# Patient Record
Sex: Female | Born: 1970 | Race: White | Hispanic: No | Marital: Married | Smoking: Never smoker
Health system: Southern US, Community
[De-identification: ages and names within clinical notes are randomized; demographics above are authoritative.]

## PROBLEM LIST (undated history)

## (undated) DIAGNOSIS — F32A Depression, unspecified: Secondary | ICD-10-CM

## (undated) DIAGNOSIS — I8290 Acute embolism and thrombosis of unspecified vein: Secondary | ICD-10-CM

## (undated) DIAGNOSIS — F329 Major depressive disorder, single episode, unspecified: Secondary | ICD-10-CM

## (undated) DIAGNOSIS — N800 Endometriosis of uterus: Secondary | ICD-10-CM

## (undated) DIAGNOSIS — D649 Anemia, unspecified: Secondary | ICD-10-CM

## (undated) DIAGNOSIS — Z86018 Personal history of other benign neoplasm: Secondary | ICD-10-CM

## (undated) DIAGNOSIS — G901 Familial dysautonomia [Riley-Day]: Secondary | ICD-10-CM

## (undated) DIAGNOSIS — R002 Palpitations: Secondary | ICD-10-CM

## (undated) DIAGNOSIS — Z8489 Family history of other specified conditions: Secondary | ICD-10-CM

## (undated) DIAGNOSIS — M722 Plantar fascial fibromatosis: Secondary | ICD-10-CM

## (undated) HISTORY — DX: Depression, unspecified: F32.A

## (undated) HISTORY — PX: TUBAL LIGATION: SHX77

## (undated) HISTORY — DX: Major depressive disorder, single episode, unspecified: F32.9

## (undated) HISTORY — DX: Plantar fascial fibromatosis: M72.2

## (undated) HISTORY — PX: LAPAROSCOPIC TUBAL LIGATION: SUR803

## (undated) HISTORY — DX: Personal history of other benign neoplasm: Z86.018

## (undated) HISTORY — DX: Palpitations: R00.2

## (undated) HISTORY — PX: OTHER SURGICAL HISTORY: SHX169

## (undated) HISTORY — DX: Anemia, unspecified: D64.9

## (undated) HISTORY — PX: ENDOMETRIAL ABLATION: SHX621

## (undated) HISTORY — DX: Endometriosis of uterus: N80.0

## (undated) HISTORY — DX: Acute embolism and thrombosis of unspecified vein: I82.90

---

## 2004-07-24 ENCOUNTER — Other Ambulatory Visit: Admission: RE | Admit: 2004-07-24 | Discharge: 2004-07-24 | Payer: Self-pay | Admitting: Obstetrics and Gynecology

## 2004-07-25 ENCOUNTER — Other Ambulatory Visit: Admission: RE | Admit: 2004-07-25 | Discharge: 2004-07-25 | Payer: Self-pay | Admitting: Obstetrics and Gynecology

## 2004-10-10 ENCOUNTER — Ambulatory Visit: Payer: Self-pay | Admitting: Hematology & Oncology

## 2004-12-05 ENCOUNTER — Ambulatory Visit (HOSPITAL_COMMUNITY): Admission: RE | Admit: 2004-12-05 | Discharge: 2004-12-05 | Payer: Self-pay | Admitting: Obstetrics and Gynecology

## 2005-02-15 ENCOUNTER — Inpatient Hospital Stay (HOSPITAL_COMMUNITY): Admission: AD | Admit: 2005-02-15 | Discharge: 2005-02-17 | Payer: Self-pay | Admitting: Obstetrics and Gynecology

## 2008-01-29 ENCOUNTER — Inpatient Hospital Stay (HOSPITAL_COMMUNITY): Admission: AD | Admit: 2008-01-29 | Discharge: 2008-01-29 | Payer: Self-pay | Admitting: Obstetrics and Gynecology

## 2008-03-23 ENCOUNTER — Ambulatory Visit (HOSPITAL_COMMUNITY): Admission: RE | Admit: 2008-03-23 | Discharge: 2008-03-23 | Payer: Self-pay | Admitting: Obstetrics & Gynecology

## 2008-04-20 ENCOUNTER — Ambulatory Visit (HOSPITAL_COMMUNITY): Admission: RE | Admit: 2008-04-20 | Discharge: 2008-04-20 | Payer: Self-pay | Admitting: Obstetrics & Gynecology

## 2008-05-16 ENCOUNTER — Inpatient Hospital Stay (HOSPITAL_COMMUNITY): Admission: AD | Admit: 2008-05-16 | Discharge: 2008-05-16 | Payer: Self-pay | Admitting: Obstetrics and Gynecology

## 2008-05-23 ENCOUNTER — Ambulatory Visit: Payer: Self-pay | Admitting: Cardiovascular Disease

## 2008-05-26 ENCOUNTER — Ambulatory Visit: Payer: Self-pay | Admitting: Cardiovascular Disease

## 2008-05-26 ENCOUNTER — Encounter: Payer: Self-pay | Admitting: Cardiovascular Disease

## 2008-05-26 ENCOUNTER — Ambulatory Visit: Payer: Self-pay

## 2008-07-04 ENCOUNTER — Encounter: Payer: Self-pay | Admitting: Cardiovascular Disease

## 2008-07-04 ENCOUNTER — Ambulatory Visit: Payer: Self-pay | Admitting: Cardiovascular Disease

## 2008-07-04 DIAGNOSIS — D689 Coagulation defect, unspecified: Secondary | ICD-10-CM | POA: Insufficient documentation

## 2008-07-04 DIAGNOSIS — R002 Palpitations: Secondary | ICD-10-CM | POA: Insufficient documentation

## 2008-07-26 ENCOUNTER — Inpatient Hospital Stay (HOSPITAL_COMMUNITY): Admission: AD | Admit: 2008-07-26 | Discharge: 2008-07-26 | Payer: Self-pay | Admitting: Obstetrics and Gynecology

## 2008-08-12 ENCOUNTER — Inpatient Hospital Stay (HOSPITAL_COMMUNITY): Admission: RE | Admit: 2008-08-12 | Discharge: 2008-08-14 | Payer: Self-pay | Admitting: Obstetrics and Gynecology

## 2008-08-18 ENCOUNTER — Inpatient Hospital Stay (HOSPITAL_COMMUNITY): Admission: AD | Admit: 2008-08-18 | Discharge: 2008-08-20 | Payer: Self-pay | Admitting: Obstetrics and Gynecology

## 2008-10-05 ENCOUNTER — Ambulatory Visit (HOSPITAL_COMMUNITY): Admission: RE | Admit: 2008-10-05 | Discharge: 2008-10-05 | Payer: Self-pay | Admitting: Obstetrics and Gynecology

## 2008-10-05 ENCOUNTER — Encounter (INDEPENDENT_AMBULATORY_CARE_PROVIDER_SITE_OTHER): Payer: Self-pay | Admitting: Obstetrics and Gynecology

## 2008-10-19 ENCOUNTER — Ambulatory Visit: Payer: Self-pay | Admitting: Oncology

## 2008-10-24 ENCOUNTER — Ambulatory Visit: Payer: Self-pay | Admitting: Cardiovascular Disease

## 2008-10-27 ENCOUNTER — Ambulatory Visit: Payer: Self-pay | Admitting: Cardiology

## 2008-10-31 ENCOUNTER — Ambulatory Visit: Payer: Self-pay | Admitting: Internal Medicine

## 2008-10-31 ENCOUNTER — Encounter: Payer: Self-pay | Admitting: Cardiovascular Disease

## 2008-10-31 LAB — CONVERTED CEMR LAB
INR: 2.1 — ABNORMAL HIGH (ref 0.0–1.5)
Prothrombin Time: 24.6 s — ABNORMAL HIGH (ref 11.6–15.2)

## 2008-11-01 LAB — CBC WITH DIFFERENTIAL/PLATELET
BASO%: 0.4 % (ref 0.0–2.0)
Basophils Absolute: 0 10*3/uL (ref 0.0–0.1)
EOS%: 2.3 % (ref 0.0–7.0)
Eosinophils Absolute: 0.1 10*3/uL (ref 0.0–0.5)
HCT: 31.9 % — ABNORMAL LOW (ref 34.8–46.6)
HGB: 10.6 g/dL — ABNORMAL LOW (ref 11.6–15.9)
LYMPH%: 25.8 % (ref 14.0–49.7)
MCH: 25.7 pg (ref 25.1–34.0)
MCHC: 33.2 g/dL (ref 31.5–36.0)
MCV: 77.3 fL — ABNORMAL LOW (ref 79.5–101.0)
MONO#: 0.2 10*3/uL (ref 0.1–0.9)
MONO%: 5.4 % (ref 0.0–14.0)
NEUT#: 3 10*3/uL (ref 1.5–6.5)
NEUT%: 66.1 % (ref 38.4–76.8)
Platelets: 256 10*3/uL (ref 145–400)
RBC: 4.13 10*6/uL (ref 3.70–5.45)
RDW: 16.5 % — ABNORMAL HIGH (ref 11.2–14.5)
WBC: 4.5 10*3/uL (ref 3.9–10.3)
lymph#: 1.2 10*3/uL (ref 0.9–3.3)

## 2008-11-04 LAB — HYPERCOAGULABLE PANEL, COMPREHENSIVE RET.
AntiThromb III Func: 130 % — ABNORMAL HIGH (ref 76–126)
Anticardiolipin IgA: <7 [APL'U] (ref <13)
Anticardiolipin IgG: <7 [GPL'U] (ref <11)
Anticardiolipin IgM: 12 [MPL'U] (ref <10)
Beta-2 Glyco I IgG: <4 U/mL (ref <20)
Beta-2-Glycoprotein I IgA: <4 U/mL (ref <10)
Beta-2-Glycoprotein I IgM: <4 U/mL (ref <10)
DRVVT 1:1 Mix: 43.3 secs (ref 36.1–47.0)
DRVVT: 75 secs — ABNORMAL HIGH (ref 36.1–47.0)
Homocysteine: 8.7 umol/L (ref 4.0–15.4)
PTT Lupus Anticoagulant: 60 secs — ABNORMAL HIGH (ref 36.3–48.8)
PTTLA 4:1 Mix: 47.4 secs (ref 36.3–48.8)
Protein C Activity: 21 % — ABNORMAL LOW (ref 75–133)
Protein C, Total: 62 % — ABNORMAL LOW (ref 70–140)
Protein S Activity: 34 % — ABNORMAL LOW (ref 69–129)
Protein S Ag, Total: 87 % (ref 70–140)

## 2008-11-04 LAB — COMPREHENSIVE METABOLIC PANEL
ALT: 15 U/L (ref 0–35)
AST: 14 U/L (ref 0–37)
Albumin: 4.2 g/dL (ref 3.5–5.2)
Alkaline Phosphatase: 83 U/L (ref 39–117)
BUN: 14 mg/dL (ref 6–23)
CO2: 23 mEq/L (ref 19–32)
Calcium: 9.2 mg/dL (ref 8.4–10.5)
Chloride: 107 mEq/L (ref 96–112)
Creatinine, Ser: 0.81 mg/dL (ref 0.40–1.20)
Glucose, Bld: 87 mg/dL (ref 70–99)
Potassium: 4.3 mEq/L (ref 3.5–5.3)
Sodium: 142 mEq/L (ref 135–145)
Total Bilirubin: 0.2 mg/dL — ABNORMAL LOW (ref 0.3–1.2)
Total Protein: 7.1 g/dL (ref 6.0–8.3)

## 2008-11-08 ENCOUNTER — Ambulatory Visit: Payer: Self-pay | Admitting: Cardiology

## 2008-11-22 ENCOUNTER — Ambulatory Visit: Payer: Self-pay | Admitting: Internal Medicine

## 2008-12-06 ENCOUNTER — Ambulatory Visit: Payer: Self-pay | Admitting: Cardiovascular Disease

## 2008-12-06 ENCOUNTER — Other Ambulatory Visit (HOSPITAL_COMMUNITY): Admission: RE | Admit: 2008-12-06 | Discharge: 2008-12-28 | Payer: Self-pay | Admitting: Psychiatry

## 2008-12-09 ENCOUNTER — Ambulatory Visit: Payer: Self-pay | Admitting: Psychiatry

## 2008-12-20 ENCOUNTER — Ambulatory Visit: Payer: Self-pay | Admitting: Cardiology

## 2008-12-29 ENCOUNTER — Encounter: Payer: Self-pay | Admitting: Cardiovascular Disease

## 2008-12-29 ENCOUNTER — Ambulatory Visit: Payer: Self-pay | Admitting: Cardiology

## 2009-01-05 ENCOUNTER — Ambulatory Visit: Payer: Self-pay | Admitting: Cardiology

## 2009-01-18 ENCOUNTER — Ambulatory Visit: Payer: Self-pay | Admitting: Cardiology

## 2009-03-06 ENCOUNTER — Ambulatory Visit: Payer: Self-pay | Admitting: Oncology

## 2009-03-13 ENCOUNTER — Encounter: Payer: Self-pay | Admitting: *Deleted

## 2009-03-13 LAB — PROTEIN S, TOTAL: Protein S Ag, Total: 122 % (ref 70–140)

## 2009-03-13 LAB — PROTEIN C, TOTAL: Protein C, Total: 113 % (ref 70–140)

## 2009-03-13 LAB — PROTEIN S ACTIVITY: Protein S Activity: 131 % — ABNORMAL HIGH (ref 69–129)

## 2009-03-13 LAB — PROTEIN C ACTIVITY: Protein C Activity: 65 % — ABNORMAL LOW (ref 75–133)

## 2010-08-28 NOTE — Assessment & Plan Note (Signed)
Summary: Cardiology Office Visit   Chief Complaint:  Palpitatoins.  History of Present Illness: Christer returns today for followup.she was having palpitations. The the patien's due date is in January.  I reviewed her event monitor which was worn between October 29 of November 18. There were no significant arrhythmias and only an occasional PVC. The patient's echocardiogram was also normal.since I last saw her she has not had any significant palpitations. She's had a bit of lower extremity edema. Overall her pregnancy is progressing well. She continues to take Lovenox shots.  She understands the importance of getting retested for protein C deficiency after her pregnancy is over. From a cardiac perspective she did not need any further testing.    Prior Medication List:  No prior medications documented  Current Allergies: No known allergies       Review of Systems       review of system is negative in regards to syncope chest pain. She does have exertional dyspnea consistent with gestational age.   Vital Signs:  Patient Profile:   40 Years Old Female Weight:      259 pounds Pulse rate:   98 / minute BP supine:   115 / 75                  Physical Exam  General:     well developed, well nourished, in no acute distress.   Head:     normocephalic and atraumatic.   Eyes:     PERRLA/EOM intact; fundi benign, conjunctiva and sclera clear.   Ears:     TMs intact and clear with normal canals and hearing.   Nose:     no deformity, discharge, inflammation, or lesions.   Mouth:     no deformity or lesions with good dentition.   Neck:     no masses, thyromegaly, or abnormal cervical nodes.   Chest Wall:     no deformities or breast masses noted.   Lungs:     clear bilaterally to A & P.   Heart:     regular rate and rhythm, S1, S2 soft systolic murmurs, rubs, gallops, or clicks.   Abdomen:     bowel sounds positive; abdomen soft non-tender without masses, organomegaly,  or hernias noted.    Protuberant consistant with gestational age MSK:     no deformity or scoliosis noted with normal posture and gait.   Pulses:     pulses normal in all 4 extremities.   Extremities:     mild edema Neurologic:     no focal deficits, CN II-XII grossly intact with normal reflexes, coordination, muscle strength and tone.   Skin:     intact without lesions or rashes.   Cervical Nodes:     no significant adenopathy.   Axillary Nodes:     no significant adenopathy.   Inguinal Nodes:     no significant adenopathy.   Psych:     alert and cooperative; normal mood and affect; normal attention span and concentration.     Impression & Recommendations:  Problem # 1:  PALPITATIONS (ICD-785.1) Benign palpitations with no significant arrythmias on event monitor. No need for further testing or imaging.  No need for beta blockers Her updated medication list for this problem includes:    Lovenox 30 Mg/0.14ml Soln (Enoxaparin sodium) ..... Sub cutaneous 30mg  two times a day   Problem # 2:  PROTEIN C DEFICIENCY (ICD-286.9) Continue Lovenox during pregnancy.  Retest for Protein  C  deficiency after pregnancy before commiting to coumadin  Medications Added to Medication List This Visit: 1)  Lovenox 30 Mg/0.52ml Soln (Enoxaparin sodium) .... Sub cutaneous 30mg  two times a day   Patient Instructions: 1)  F/U with me in March 2)  Call if palpitations worse 3)  Get hematology labs done about 4 weeks post delivery    ]

## 2010-08-28 NOTE — Letter (Signed)
Summary: [facility]   [facility]   Imported By: [name] 01/02/2010 12:13:18  _____________________________________________________________________  External Attachment:    Type:   Image     Comment:   External Document

## 2010-11-08 LAB — CBC
HCT: 34.2 % — ABNORMAL LOW (ref 36.0–46.0)
Hemoglobin: 11.1 g/dL — ABNORMAL LOW (ref 12.0–15.0)
MCHC: 32.5 g/dL (ref 30.0–36.0)
MCV: 80.3 fL (ref 78.0–100.0)
Platelets: 219 10*3/uL (ref 150–400)
RBC: 4.26 MIL/uL (ref 3.87–5.11)
RDW: 18.2 % — ABNORMAL HIGH (ref 11.5–15.5)
WBC: 5.9 10*3/uL (ref 4.0–10.5)

## 2010-11-08 LAB — PROTEIN C, TOTAL: Protein C, Total: 104 % (ref 70–140)

## 2010-11-08 LAB — URINALYSIS, ROUTINE W REFLEX MICROSCOPIC
Bilirubin Urine: NEGATIVE
Glucose, UA: NEGATIVE mg/dL
Hgb urine dipstick: NEGATIVE
Ketones, ur: NEGATIVE mg/dL
Nitrite: NEGATIVE
Protein, ur: NEGATIVE mg/dL
Specific Gravity, Urine: 1.01 (ref 1.005–1.030)
Urobilinogen, UA: 0.2 mg/dL (ref 0.0–1.0)
pH: 6 (ref 5.0–8.0)

## 2010-11-08 LAB — PROTEIN C ACTIVITY: Protein C Activity: 193 % — ABNORMAL HIGH (ref 75–133)

## 2010-11-08 LAB — PREGNANCY, URINE: Preg Test, Ur: NEGATIVE

## 2010-11-12 LAB — RH IMMUNE GLOB WKUP(>/=20WKS)(NOT WOMEN'S HOSP): Fetal Screen: NEGATIVE

## 2010-11-12 LAB — CREATININE, SERUM
Creatinine, Ser: 0.45 mg/dL (ref 0.4–1.2)
GFR calc Af Amer: >60 mL/min (ref >60)
GFR calc non Af Amer: >60 mL/min (ref >60)

## 2010-11-12 LAB — CBC
HCT: 29.1 % — ABNORMAL LOW (ref 36.0–46.0)
HCT: 23.2 % — ABNORMAL LOW (ref 36.0–46.0)
HCT: 27.4 % — ABNORMAL LOW (ref 36.0–46.0)
Hemoglobin: 9.5 g/dL — ABNORMAL LOW (ref 12.0–15.0)
Hemoglobin: 7.8 g/dL — CL (ref 12.0–15.0)
Hemoglobin: 9.1 g/dL — ABNORMAL LOW (ref 12.0–15.0)
MCHC: 32.7 g/dL (ref 30.0–36.0)
MCHC: 33.2 g/dL (ref 30.0–36.0)
MCHC: 33.6 g/dL (ref 30.0–36.0)
MCV: 80.8 fL (ref 78.0–100.0)
MCV: 80.8 fL (ref 78.0–100.0)
MCV: 81.1 fL (ref 78.0–100.0)
Platelets: 183 10*3/uL (ref 150–400)
Platelets: 155 10*3/uL (ref 150–400)
Platelets: 196 10*3/uL (ref 150–400)
RBC: 3.6 MIL/uL — ABNORMAL LOW (ref 3.87–5.11)
RBC: 2.86 MIL/uL — ABNORMAL LOW (ref 3.87–5.11)
RBC: 3.39 MIL/uL — ABNORMAL LOW (ref 3.87–5.11)
RDW: 19.1 % — ABNORMAL HIGH (ref 11.5–15.5)
RDW: 19 % — ABNORMAL HIGH (ref 11.5–15.5)
RDW: 20 % — ABNORMAL HIGH (ref 11.5–15.5)
WBC: 8.8 10*3/uL (ref 4.0–10.5)
WBC: 13.9 10*3/uL — ABNORMAL HIGH (ref 4.0–10.5)
WBC: 6.2 10*3/uL (ref 4.0–10.5)

## 2010-11-12 LAB — DIFFERENTIAL
Basophils Absolute: 0 10*3/uL (ref 0.0–0.1)
Basophils Relative: 0 % (ref 0–1)
Eosinophils Absolute: 0.1 10*3/uL (ref 0.0–0.7)
Eosinophils Relative: 2 % (ref 0–5)
Lymphocytes Relative: 15 % (ref 12–46)
Lymphs Abs: 0.9 10*3/uL (ref 0.7–4.0)
Monocytes Absolute: 0.6 10*3/uL (ref 0.1–1.0)
Monocytes Relative: 10 % (ref 3–12)
Neutro Abs: 4.6 10*3/uL (ref 1.7–7.7)
Neutrophils Relative %: 74 % (ref 43–77)

## 2010-11-12 LAB — RPR: RPR Ser Ql: NONREACTIVE

## 2010-12-11 NOTE — Assessment & Plan Note (Signed)
[facility]                            CARDIOLOGY OFFICE NOTE   Maria, Moore                      MRN:          [id number]  DATE:05/23/2008                            DOB:          30-Dec-1970    REFERRING PHYSICIAN:  Carrington Clamp, MD   Maria Moore is a delightful 37-year patient referred by Dr. Henderson Cloud.  She is gravida 5, para 4.  She has been having palpitations and  dizziness.   The patient carries a diagnosis of protein C deficiency and is on  Lovenox.  In talking to her, she had day a blood clot in her legs  diagnosed after the birth of her first child.  Apparently, this was in  [place].  She has not had any recurrences since that time.  However,  since that time, she has not been on Coumadin or anticoagulants.  Apparently, she was told that if she did not have a second blood clot  not to worry about it.  The patient's palpitations appear benign.  She  tends to be tachycardic with her pregnancy.  She has occasional flip-  flops and feels 3 or 4 heart beats that feel strong.  Sometimes she can  be dizzy without the palpitations.  Her dizziness is actually  independent of position and not related to her palpitations.  It sounds  more vertiginous.   She has not had a significant headache.  She has actually not passed  out.  Her pregnancy is otherwise uncomplicated.  All of her previous  births have been vaginal.   She is currently 24 leaks along and both her and the baby seemed to be  doing well.  She is getting Lovenox shots twice a day.   PAST MEDICAL HISTORY:  Otherwise remarkable for a history of protein C  deficiency, history of tubes, inner ear, and a D&C.   She has otherwise been fairly healthy.   FAMILY HISTORY:  Remarkable for her mother having some kind of  congestive or congenital heart problem at the age of 30.  She is still  alive.  Father still alive.  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  She has  4 healthy children.  She is happily married.  She does not drink or  smoke.   She has 1 cup of coffee a day.   REVIEW OF SYSTEMS:  Otherwise remarkable for typical swelling in the  ankles from her pregnancy.   Her current medications include Lovenox 30 mg subcu b.i.d. and prenatal  vitamins.   ALLERGIES:  She has no known allergies.   PHYSICAL EXAMINATION:  GENERAL:  Remarkable for a middle-aged white  female in no distress.  VITAL SIGNS:  Her weight is 254, blood pressure is 117/77, pulse is 95  and regular, respiratory 14, afebrile.  HEENT:  Unremarkable.  NECK:  Carotids normal without bruit. No lymphadenopathy, no  thyromegaly, no JVP elevation.  LUNGS:  Clear, good diaphragmatic motion. No wheezing.  CARDIAC:  S1, S2 with normal heart sounds. PMI not  palpable.  ABDOMEN:  Protuberant consistent with gestational age.  No AAA, no  tenderness, no hepatosplenomegaly, no hepatojugular reflux, or  tenderness.  EXTREMITIES:  Distal pulses were intact, trace edema.  NEUROLOGIC:  Nonfocal.  SKIN:  Warm and dry.  MUSCULOSKELETAL:  No muscular weakness.   Her electrocardiogram shows normal sinus rhythm, poor R-wave progression  lightly to lead placement, normal P-waves upright in II, III, and F.  QT  interval 344.   Routine lab work done May 16, 2008, showed a creatinine of 0.5 and  negative cardiac markers   BNP was less than 30.   IMPRESSION:  1. Palpitations, they sound benign.  We have given her event monitor      for 4-6 weeks.  No beta-blocker therapy or other indicated at this      time.  2. Dizziness with dyspnea likely again related to her pregnancy.  No      evidence of CNS pathology.  Check 2-D echocardiogram and assess      right ventricular and left ventricular function.  3. Question protein C deficiency.  I find it a little strange that the      patient has been placed on Lovenox during this pregnancy,  but does      not take Coumadin on a regular basis.  I strongly encouraged her to      see a hematologist and get protein C levels drawn again after her      pregnancy to see if her levels are truly suppressed and if she is      at risk for clotting events even when she is not pregnant.  She      would concur with this.   I will see her back after her echo on event monitor.     Noralyn Pick. Eden Emms, MD, [facility]  Electronically Signed    PCN/MedQ  DD: 05/23/2008  DT: 05/24/2008  Job #: [id number]

## 2010-12-11 NOTE — Assessment & Plan Note (Signed)
[facility]                                 ON-CALL NOTE   Maria Moore, Maria Moore                      MRN:          [id number]  DATE:10/28/2008                            DOB:          [birth year]    PRIMARY CARDIOLOGIST:  Noralyn Pick. Eden Emms, MD, [facility]   [name] called through the answering service today requesting a  refill on medication.  She states she was seen in the [place] office  on Thursday, October 27, 2008 with a subtherapeutic INR and was told that a  prescription for Lovenox would be send in to the [facility], on [address] in [place].  She states she went to pick up the refill on her  Lovenox and there was not a prescription there.  In talking with [name], she reports a history of blood clots and has been on Lovenox  since October 17, 2008 and was started on Coumadin on October 20, 2008, and  is still subtherapeutic.  She states she has an appointment to have her  INR repeated on Monday, but is requesting refill on the Lovenox.  I did  speak with the pharmacy at [facility] and confirmed the patient is on  Lovenox 100 mg b.i.d. and Coumadin 5 mg daily or as directed.  I also  raised 20 doses of the Lovenox without refills and the patient is to  follow up with a repeat PT/INR on Monday.  The patient is aware of  instructions.      Dorian Pod, ACNP  Electronically Signed      Luis Abed, MD, [facility]  Electronically Signed   MB/MedQ  DD: 10/28/2008  DT: 10/28/2008  Job #: [id number]

## 2010-12-11 NOTE — Discharge Summary (Signed)
NAME:  Maria Moore, Maria Moore               ACCOUNT NO.:  [id number]   MEDICAL RECORD NO.:  [id number]          PATIENT TYPE:  INP   LOCATION:  9320                          FACILITY:  [facility]   PHYSICIAN:  Carrington Clamp, M.D. DATE OF BIRTH:  10/09/1970   DATE OF ADMISSION:  08/18/2008  DATE OF DISCHARGE:  08/20/2008                               DISCHARGE SUMMARY   ADMITTING DIAGNOSIS:  Postpartum endometritis.   DISCHARGE DIAGNOSIS:  Postpartum endometritis.   PERTINENT TESTS PERFORMED:  White blood cell count of 6.2 and H&H of 7.8  and 23.2.   PERTINENT PROCEDURES PERFORMED:  IV antibiotics.   HISTORY AND PHYSICAL:  Please refer to history and physical on chart.  Briefly, A 40 year old G7, P5, postoperative day #6 with increased pain  and chills previous night and difficulty walking.  The patient did not  complain of nausea or vomiting, but was having significant abdominal  pain.  The patient states her temperature last night had been elevated  and had gone to 99.3 on Motrin.  The patient had been treated for  postpartum endometritis at the hospital after the delivery.  See the  chart for the rest of the history.  Significantly, the patient was on  Lovenox 30 mg subcu b.i.d. secondary to history of protein C deficiency  (protein C deficiency is questionable at this point).  The patient was  admitted for amp, gent, and clindamycin IV antibiotics, and by  postoperative day #3, had been afebrile with significant decrease in  pain.  The patient was feeling better and was discharged with the  following, Augmentin 875 mg 1 p.o. b.i.d., continue the Lovenox, iron  over-the-counter as tolerated, and the patient was to follow up in 1  week.      Carrington Clamp, M.D.  Electronically Signed     MH/MEDQ  D:  11/03/2008  T:  11/04/2008  Job:  [id number]

## 2010-12-11 NOTE — Op Note (Signed)
NAME:  Maria Moore, Maria Moore NO.:  [id number]   MEDICAL RECORD NO.:  [id number]          PATIENT TYPE:  AMB   LOCATION:  SDC                           [facility]ACILITY:  [facility]   PHYSICIAN:  Carrington Clamp, M.D. DATE OF BIRTH:  06-Sep-1970   DATE OF PROCEDURE:  DATE OF DISCHARGE:                               OPERATIVE REPORT   PREOPERATIVE DIAGNOSIS:  Multiparous, desires permit sterility and  menorrhagia.   POSTOPERATIVE DIAGNOSES:  Multiparous, desires permit sterility and  menorrhagia, plus endometrial polyps.   PROCEDURES:  Filshie clip, bilateral tubal ligation, dilation and  curettage, hysteroscopy, and NovaSure ablation.   SURGEON:  Carrington Clamp, MD   ASSISTANTS:  None.   ANESTHESIA:  General.   FINDINGS:  A 10- to 11-week size boggy uterus.  The right ovary appeared  normal.  The left ovary was about 4 cm with multiple functional cyst,  otherwise appearing normal.  Tubes were normal bilaterally.  There were  polyps in the endometrial cavity.   SPECIMENS:  Uterine curettings sent to Pathology.   ESTIMATED BLOOD LOSS:  Minimal.   IV FLUIDS:   URINE OUTPUT:  Not measured.   COMPLICATIONS:  The uterine cavity sounded to 10 cm, but may have been  bigger.  Post NovaSure ablation, it appeared that the upper portion of  the uterine cavity had not been contacted with the NovaSure instrument.  The patient was expected to have some relief, but may not be amenorrheic  postoperatively.  The hysteroscopic deficit was 70 mL.  Cavity length  was measured at 7, but we were only able to extend the instrument to  6.5.  Cavity width was 4.2, power was 150, and time was 50 seconds.  Small laceration to cervix with a single-tooth tenaculum.   TECHNIQUE:  After adequate general anesthesia was achieved, the patient  was prepped and draped in usual sterile fashion with SCDs on secondary  to history of protein S deficiency.  A 1 cm incision was made below the  umbilicus with  the scalpel, and the Veress needle was passed into the  abdomen without aspiration of bowel contents or blood.  The abdomen was  insufflated, and the 10-mm trocar was placed without complication.  The  scope was passed into the abdomen and the above findings noted.  A  Filshie clip was placed on the isthmic portion of each tube and was  confirmed to be around the entirety of the tube.  The scope was then  removed from the abdomen.  Abdomen was desufflated, but the trocar left  in place.  Attention was then turned to the vagina where the uterine  manipulator that had been placed before the abdominal incision was  removed and the cervix was grasped with a single-tooth tenaculum.  The  cervix was dilated up, and using multiple modalities the estimated  cavity length was seen at 10 cm.  The cervix was measured at 3, and the  NovaSure instrument was set at 6.5.  The hysteroscope was then passed  into the cavity, and the cervical length was confirmed again.  The above  findings were noted.  It was difficult to see the tube orifices as there  was a lot of tissue.  D and C was then performed where a large amount of  tissue was obtained and sent to Pathology.  The NovaSure instrument was  then set with the above settings and proceeded for 50 seconds after the  CO2 cavity assessment test was passed.  The hysteroscope was passed into  the uterine cavity afterwards, and the top of the uterus was noticed to  have not enough contact.  It is likely that despite the efforts to  sounds correctly that the cavity length was actually longer then had  been expected and that this was the optimal results we could achieve  with the NovaSure.   All instruments were withdrawn from the uterus, and a small tear on the  cervix was repaired with 2 stitches of 2-0 Vicryl.  Once hemostasis was  achieved, the instruments were withdrawn from the vagina and after the  uterine manipulator had been replaced and the bladder  emptied again of  clear urine.  The scope was passed into the abdominal cavity to indicate  intact uterus without any abnormalities and everything was removed from  the abdomen.  Abdomen was desufflated.  The 1-cm fascial incision was  closed with a figure-of-eight stitch of 2-0 Vicryl.  The skin was closed  with Dermabond.  All instruments were withdrawn from the vagina once the  cervix was again seen as being hemostatic.  The patient tolerated the  procedure well, was returned to recovery room in stable condition.      Carrington Clamp, M.D.  Electronically Signed     MH/MEDQ  D:  10/05/2008  T:  10/05/2008  Job:  [id number]

## 2011-01-17 ENCOUNTER — Encounter: Payer: Self-pay | Admitting: Cardiovascular Disease

## 2011-04-29 LAB — CK TOTAL AND CKMB (NOT AT ARMC)
CK, MB: 1
Relative Index: INVALID
Total CK: 45

## 2011-04-29 LAB — B-NATRIURETIC PEPTIDE (CONVERTED LAB): Pro B Natriuretic peptide (BNP): <30

## 2011-04-29 LAB — TROPONIN I: Troponin I: 0.01

## 2011-04-29 LAB — COMPREHENSIVE METABOLIC PANEL
ALT: 19
AST: 15
Albumin: 2.6 — ABNORMAL LOW
Alkaline Phosphatase: 60
BUN: 5 — ABNORMAL LOW
CO2: 24
Calcium: 8.5
Chloride: 108
Creatinine, Ser: 0.53
GFR calc Af Amer: >60
GFR calc non Af Amer: >60
Glucose, Bld: 86
Potassium: 3.9
Sodium: 136
Total Bilirubin: 0.3
Total Protein: 5.5 — ABNORMAL LOW

## 2011-06-11 ENCOUNTER — Ambulatory Visit: Payer: Self-pay | Admitting: Internal Medicine

## 2011-06-11 ENCOUNTER — Encounter: Payer: Self-pay | Admitting: Internal Medicine

## 2011-06-11 ENCOUNTER — Ambulatory Visit (INDEPENDENT_AMBULATORY_CARE_PROVIDER_SITE_OTHER): Payer: 59 | Admitting: Internal Medicine

## 2011-06-11 DIAGNOSIS — E669 Obesity, unspecified: Secondary | ICD-10-CM

## 2011-06-11 DIAGNOSIS — D689 Coagulation defect, unspecified: Secondary | ICD-10-CM

## 2011-06-11 DIAGNOSIS — Z8672 Personal history of thrombophlebitis: Secondary | ICD-10-CM

## 2011-06-11 DIAGNOSIS — J40 Bronchitis, not specified as acute or chronic: Secondary | ICD-10-CM

## 2011-06-11 DIAGNOSIS — H6690 Otitis media, unspecified, unspecified ear: Secondary | ICD-10-CM

## 2011-06-11 DIAGNOSIS — Z86718 Personal history of other venous thrombosis and embolism: Secondary | ICD-10-CM | POA: Insufficient documentation

## 2011-06-11 DIAGNOSIS — Z9851 Tubal ligation status: Secondary | ICD-10-CM

## 2011-06-11 LAB — CBC WITH DIFFERENTIAL/PLATELET
Basophils Absolute: 0 10*3/uL (ref 0.0–0.1)
Basophils Relative: 0.5 % (ref 0.0–3.0)
Eosinophils Absolute: 0.2 10*3/uL (ref 0.0–0.7)
Eosinophils Relative: 3.1 % (ref 0.0–5.0)
HCT: 36.7 % (ref 36.0–46.0)
Hemoglobin: 12.4 g/dL (ref 12.0–15.0)
Lymphocytes Relative: 31.8 % (ref 12.0–46.0)
Lymphs Abs: 1.9 10*3/uL (ref 0.7–4.0)
MCHC: 33.8 g/dL (ref 30.0–36.0)
MCV: 84.3 fl (ref 78.0–100.0)
Monocytes Absolute: 0.3 10*3/uL (ref 0.1–1.0)
Monocytes Relative: 5.6 % (ref 3.0–12.0)
Neutro Abs: 3.5 10*3/uL (ref 1.4–7.7)
Neutrophils Relative %: 59 % (ref 43.0–77.0)
Platelets: 191 10*3/uL (ref 150.0–400.0)
RBC: 4.36 Mil/uL (ref 3.87–5.11)
RDW: 16.6 % — ABNORMAL HIGH (ref 11.5–14.6)
WBC: 6 10*3/uL (ref 4.5–10.5)

## 2011-06-11 MED ORDER — BENZONATATE 200 MG PO CAPS: 200.0000 mg | ORAL_CAPSULE | Freq: Three times a day (TID) | ORAL | Status: AC | PRN

## 2011-06-11 MED ORDER — GUAIFENESIN-CODEINE 100-10 MG/5ML PO SYRP: 5.0000 mL | ORAL_SOLUTION | Freq: Two times a day (BID) | ORAL | Status: DC | PRN

## 2011-06-11 MED ORDER — AMOXICILLIN-POT CLAVULANATE 875-125 MG PO TABS: 1.0000 | ORAL_TABLET | Freq: Two times a day (BID) | ORAL | Status: DC
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
## 2011-06-11 NOTE — Assessment & Plan Note (Signed)
Diagnosis was ruled out  By Hematology evaluation in 2010 .

## 2011-06-12 ENCOUNTER — Encounter: Payer: Self-pay | Admitting: Internal Medicine

## 2011-06-12 DIAGNOSIS — I8290 Acute embolism and thrombosis of unspecified vein: Secondary | ICD-10-CM | POA: Insufficient documentation

## 2011-06-12 LAB — COMPREHENSIVE METABOLIC PANEL
ALT: 24 U/L (ref 0–35)
AST: 26 U/L (ref 0–37)
Albumin: 3.8 g/dL (ref 3.5–5.2)
Alkaline Phosphatase: 68 U/L (ref 39–117)
BUN: 12 mg/dL (ref 6–23)
CO2: 25 mEq/L (ref 19–32)
Calcium: 9 mg/dL (ref 8.4–10.5)
Chloride: 107 mEq/L (ref 96–112)
Creatinine, Ser: 0.7 mg/dL (ref 0.4–1.2)
GFR: 92.77 mL/min (ref >60.00)
Glucose, Bld: 71 mg/dL (ref 70–99)
Potassium: 3.9 mEq/L (ref 3.5–5.1)
Sodium: 140 mEq/L (ref 135–145)
Total Bilirubin: 0.5 mg/dL (ref 0.3–1.2)
Total Protein: 7.1 g/dL (ref 6.0–8.3)

## 2011-06-12 LAB — LIPID PANEL
Cholesterol: 219 mg/dL — ABNORMAL HIGH (ref 0–200)
HDL: 51.4 mg/dL (ref >39.00)
Total CHOL/HDL Ratio: 4
Triglycerides: 91 mg/dL (ref 0.0–149.0)
VLDL: 18.2 mg/dL (ref 0.0–40.0)

## 2011-06-12 LAB — TSH: TSH: 1.39 u[IU]/mL (ref 0.35–5.50)

## 2011-06-12 LAB — LDL CHOLESTEROL, DIRECT: Direct LDL: 164.4 mg/dL

## 2011-06-17 ENCOUNTER — Encounter: Payer: Self-pay | Admitting: Internal Medicine

## 2011-06-17 ENCOUNTER — Other Ambulatory Visit: Payer: 59

## 2011-06-17 ENCOUNTER — Ambulatory Visit (INDEPENDENT_AMBULATORY_CARE_PROVIDER_SITE_OTHER): Payer: 59 | Admitting: Internal Medicine

## 2011-06-17 VITALS — BP 116/76 | HR 97 | Temp 98.5°F | Resp 14 | Ht 64.5 in | Wt 251.0 lb

## 2011-06-17 DIAGNOSIS — H6690 Otitis media, unspecified, unspecified ear: Secondary | ICD-10-CM

## 2011-06-17 DIAGNOSIS — D649 Anemia, unspecified: Secondary | ICD-10-CM

## 2011-06-17 DIAGNOSIS — E611 Iron deficiency: Secondary | ICD-10-CM

## 2011-06-17 MED ORDER — AMOXICILLIN-POT CLAVULANATE 875-125 MG PO TABS: 1.0000 | ORAL_TABLET | Freq: Two times a day (BID) | ORAL | Status: AC

## 2011-06-17 NOTE — Progress Notes (Signed)
  Subjective:    Patient ID: Maria Moore, female    DOB: 09/27/1970, 40 y.o.   MRN: [id number]  HPI  40 yo white female treated last week at first visit for bronchitis, returns today with improved cough but worsening facial pain and left ear pain.  Entire household is also sick, coughing.  No dizziness, wheezing, severe headaches or neck pain,.  No GI symptoms or GYN symptoms.   Past Medical History  Diagnosis Date  . Palpitations   . VTE (venous thromboembolism)     post partum, post procedure, Protein C deficiency ruled out    Current Outpatient Prescriptions on File Prior to Visit  Medication Sig Dispense Refill  . benzonatate (TESSALON) 200 MG capsule Take 1 capsule (200 mg total) by mouth 3 (three) times daily as needed for cough.  60 capsule  3  . guaiFENesin-codeine (ROBITUSSIN AC) 100-10 MG/5ML syrup Take 5 mLs by mouth 2 (two) times daily as needed for cough.  240 mL  0  . sertraline (ZOLOFT) 100 MG tablet Take 100 mg by mouth daily.          Review of Systems  Constitutional: Negative for fever, chills and unexpected weight change.  HENT: Negative for hearing loss, ear pain, nosebleeds, congestion, sore throat, facial swelling, rhinorrhea, sneezing, mouth sores, trouble swallowing, neck pain, neck stiffness, voice change, postnasal drip, sinus pressure, tinnitus and ear discharge.   Eyes: Negative for pain, discharge, redness and visual disturbance.  Respiratory: Negative for cough, chest tightness, shortness of breath, wheezing and stridor.   Cardiovascular: Negative for chest pain, palpitations and leg swelling.  Musculoskeletal: Negative for myalgias and arthralgias.  Skin: Negative for color change and rash.  Neurological: Negative for dizziness, weakness, light-headedness and headaches.  Hematological: Negative for adenopathy.       Objective:   Physical Exam  Constitutional: She is oriented to person, place, and time. She appears well-developed and well-nourished.   HENT:  Right Ear: Tympanic membrane is injected and bulging.  Left Ear: Tympanic membrane is injected. Tympanic membrane mobility is abnormal.  Mouth/Throat: Mucous membranes are normal. Posterior oropharyngeal edema and posterior oropharyngeal erythema present. No oropharyngeal exudate or tonsillar abscesses.  Eyes: EOM are normal. Pupils are equal, round, and reactive to light. No scleral icterus.  Neck: Normal range of motion. Neck supple. No JVD present. No thyromegaly present.  Cardiovascular: Normal rate, regular rhythm, normal heart sounds and intact distal pulses.   Pulmonary/Chest: Effort normal and breath sounds normal.  Abdominal: Soft. Bowel sounds are normal. She exhibits no mass. There is no tenderness.  Musculoskeletal: Normal range of motion. She exhibits no edema.  Lymphadenopathy:    She has no cervical adenopathy.  Neurological: She is alert and oriented to person, place, and time.  Skin: Skin is warm and dry.  Psychiatric: She has a normal mood and affect.          Assessment & Plan:  Otitis media: resutling from sinus congestion.  Change abx to augmentin.  Add sudafed PE 10 mg every 6 hours.   Microcytosis: with history of anemia.  Will check iron stores, B12 and folate.

## 2011-06-17 NOTE — Patient Instructions (Addendum)
Your exam is now consistent with otitis media,  Which probably resulted from the congestion you have been having,  Please take Sudafed PE 10 mg every 6 hours for ear pain and sinus congestion  We are changing antibiotics to Augmentin one tablet twice daily for 7 more days

## 2011-06-18 LAB — IRON AND TIBC
%SAT: 8 % — ABNORMAL LOW (ref 20–55)
Iron: 30 ug/dL — ABNORMAL LOW (ref 42–145)
TIBC: 353 ug/dL (ref 250–470)
UIBC: 323 ug/dL (ref 125–400)

## 2011-06-18 LAB — FOLATE RBC: RBC Folate: 681 ng/mL (ref >=366)

## 2011-06-18 LAB — FERRITIN: Ferritin: 13.2 ng/mL (ref 10.0–291.0)

## 2011-06-18 LAB — VITAMIN B12: Vitamin B-12: 494 pg/mL (ref 211–911)

## 2011-06-18 MED ORDER — FERROUS SULFATE 325 (65 FE) MG PO TABS: 325.0000 mg | ORAL_TABLET | Freq: Every day | ORAL | Status: DC

## 2011-06-18 NOTE — Progress Notes (Signed)
Addended by: Duncan Dull on: 06/18/2011 05:24 PM   Modules accepted: Orders

## 2011-07-04 ENCOUNTER — Encounter: Payer: Self-pay | Admitting: Internal Medicine

## 2011-10-04 ENCOUNTER — Encounter: Payer: Self-pay | Admitting: Internal Medicine

## 2011-10-04 ENCOUNTER — Ambulatory Visit (INDEPENDENT_AMBULATORY_CARE_PROVIDER_SITE_OTHER): Payer: 59 | Admitting: Internal Medicine

## 2011-10-04 VITALS — BP 134/90 | HR 120 | Temp 98.6°F | Resp 18 | Ht 64.0 in | Wt 254.2 lb

## 2011-10-04 DIAGNOSIS — J45901 Unspecified asthma with (acute) exacerbation: Secondary | ICD-10-CM

## 2011-10-04 DIAGNOSIS — J209 Acute bronchitis, unspecified: Secondary | ICD-10-CM | POA: Insufficient documentation

## 2011-10-04 DIAGNOSIS — J45909 Unspecified asthma, uncomplicated: Secondary | ICD-10-CM

## 2011-10-04 DIAGNOSIS — J40 Bronchitis, not specified as acute or chronic: Secondary | ICD-10-CM

## 2011-10-04 MED ORDER — PREDNISONE (PAK) 10 MG PO TABS: ORAL_TABLET | ORAL | Status: AC

## 2011-10-04 MED ORDER — ALBUTEROL SULFATE HFA 108 (90 BASE) MCG/ACT IN AERS: 2.0000 | INHALATION_SPRAY | Freq: Four times a day (QID) | RESPIRATORY_TRACT | Status: DC | PRN

## 2011-10-04 MED ORDER — AZITHROMYCIN 500 MG PO TABS: 500.0000 mg | ORAL_TABLET | Freq: Every day | ORAL | Status: AC

## 2011-10-04 MED ORDER — METHYLPREDNISOLONE ACETATE PF 40 MG/ML IJ SUSP
40.0000 mg | Freq: Once | INTRAMUSCULAR | Status: AC
  Administered 2011-10-04: 40 mg via INTRAMUSCULAR

## 2011-10-04 MED ORDER — HYDROCOD POLST-CHLORPHEN POLST 10-8 MG/5ML PO LQCR: 5.0000 mL | Freq: Two times a day (BID) | ORAL | Status: DC | PRN

## 2011-10-04 NOTE — Progress Notes (Signed)
  Subjective:    Patient ID: Maria Moore, female    DOB: July 09, 1971, 41 y.o.   MRN: [id number]  HPI  41 yr old white female presents with one week history of nasal congestion, ear pain,  Now with wheezing and productive cough.  No relief with robbitussin ac,  Has been having low grade fevers.  Her children are sick.  She denies flu like symptoms.   Past Medical History  Diagnosis Date  . Palpitations   . VTE (venous thromboembolism)     post partum, post procedure, Protein C deficiency ruled out   Current Outpatient Prescriptions on File Prior to Visit  Medication Sig Dispense Refill  . ferrous sulfate 325 (65 FE) MG tablet Take 1 tablet (325 mg total) by mouth daily.  30 tablet  3  . guaiFENesin-codeine (ROBITUSSIN AC) 100-10 MG/5ML syrup Take 5 mLs by mouth 2 (two) times daily as needed for cough.  240 mL  0  . sertraline (ZOLOFT) 100 MG tablet Take 100 mg by mouth daily.          Review of Systems  Constitutional: Positive for fever. Negative for chills and unexpected weight change.  HENT: Positive for ear pain, congestion, rhinorrhea, postnasal drip and sinus pressure. Negative for hearing loss, nosebleeds, sore throat, facial swelling, sneezing, mouth sores, trouble swallowing, neck pain, neck stiffness, voice change, tinnitus and ear discharge.   Eyes: Negative for pain, discharge, redness and visual disturbance.  Respiratory: Negative for cough, chest tightness, shortness of breath, wheezing and stridor.   Cardiovascular: Negative for chest pain, palpitations and leg swelling.  Musculoskeletal: Negative for myalgias and arthralgias.  Skin: Negative for color change and rash.  Neurological: Positive for headaches. Negative for dizziness, weakness and light-headedness.  Hematological: Negative for adenopathy.       Objective:   Physical Exam  Constitutional: She is oriented to person, place, and time. She appears well-developed and well-nourished.  HENT:  Mouth/Throat:  Oropharynx is clear and moist.  Eyes: EOM are normal. Pupils are equal, round, and reactive to light. No scleral icterus.  Neck: Normal range of motion. Neck supple. No JVD present. No thyromegaly present.  Cardiovascular: Normal rate, regular rhythm, normal heart sounds and intact distal pulses.   Pulmonary/Chest: Effort normal and breath sounds normal.  Lymphadenopathy:    She has no cervical adenopathy.  Neurological: She is alert and oriented to person, place, and time.  Skin: Skin is warm and dry.  Psychiatric: She has a normal mood and affect.      Assessment & Plan:   Bronchitis: Given chronicity of symptoms, development of facial pain and exam consistent with bacterial URI,  Will treat with empiric antibiotics, decongestants, prednisone and bronchidilator, and saline lavage.

## 2011-10-04 NOTE — Assessment & Plan Note (Signed)
prednisone taper, azithromycin,  albuterol neb sudafed PE/afrin for nasal congestion,  And tussionex.

## 2011-10-04 NOTE — Patient Instructions (Addendum)
I am prescribing you a prednisone taper, a 7 day course of azithromycin,  And albuterol inhaler if needed for wheezing/chest tightness (use every 6 hours if needed).  I am calling in tussionex for the nighttime cough.  Use the old robitussin or Delsym OTC for daytime cough  You should use either sudafed PE/afrin for nasal congestion or Afrin nasal spray twice daily for 5 days for the ear and sinus congestion   I also recommending using Simply Saline nasal spray twice daily to flush your sinuses out. (available OTC)  Return in 3 months for repeat CBC and iron studies

## 2011-10-06 ENCOUNTER — Encounter: Payer: Self-pay | Admitting: Internal Medicine

## 2012-03-16 ENCOUNTER — Ambulatory Visit (INDEPENDENT_AMBULATORY_CARE_PROVIDER_SITE_OTHER): Payer: 59 | Admitting: Internal Medicine

## 2012-03-16 ENCOUNTER — Encounter: Payer: Self-pay | Admitting: Internal Medicine

## 2012-03-16 VITALS — BP 118/80 | HR 120 | Temp 98.0°F | Resp 16 | Ht 64.0 in | Wt 263.0 lb

## 2012-03-16 DIAGNOSIS — D649 Anemia, unspecified: Secondary | ICD-10-CM

## 2012-03-16 DIAGNOSIS — R000 Tachycardia, unspecified: Secondary | ICD-10-CM

## 2012-03-16 DIAGNOSIS — G901 Familial dysautonomia [Riley-Day]: Secondary | ICD-10-CM | POA: Insufficient documentation

## 2012-03-16 DIAGNOSIS — R195 Other fecal abnormalities: Secondary | ICD-10-CM

## 2012-03-16 DIAGNOSIS — D509 Iron deficiency anemia, unspecified: Secondary | ICD-10-CM

## 2012-03-16 DIAGNOSIS — R5383 Other fatigue: Secondary | ICD-10-CM

## 2012-03-16 DIAGNOSIS — Z1211 Encounter for screening for malignant neoplasm of colon: Secondary | ICD-10-CM

## 2012-03-16 DIAGNOSIS — E669 Obesity, unspecified: Secondary | ICD-10-CM

## 2012-03-16 DIAGNOSIS — G4710 Hypersomnia, unspecified: Secondary | ICD-10-CM

## 2012-03-16 NOTE — Assessment & Plan Note (Addendum)
Etiology of sinus tach unclear,  Normal thyroid function in November. , with Q waves noted in leads II and III on today's EKG and no prior for comparison .  ECHO done several years ago was normal.   Pending evaluation of anemia,  Will start low dose beta blocker

## 2012-03-16 NOTE — Assessment & Plan Note (Signed)
In a patient with obesity and history of snoring.  Sleep study ordered.

## 2012-03-16 NOTE — Progress Notes (Signed)
Patient ID: Maria Moore, female   DOB: Oct 27, 1970, 41 y.o.   MRN: [id number]  Patient Active Problem List  Diagnosis  . PROTEIN C DEFICIENCY  . PALPITATIONS  . Otitis  . History of DVT of lower extremity  . S/P tubal ligation  . VTE (venous thromboembolism)  . Bronchitis with asthma, acute  . Anemia  . Tachycardia  . Fatigue    Subjective:  CC:   Chief Complaint  Patient presents with  . Fatigue  . Thyroid Problem    possible symptoms    HPI:   Maria Moore a 41 y.o. female who presents Fatigue, hypersomnolence. She and husband both snore. She has been taking oral iron supplements since last visit when iron deficiency was noted.  No significant improvement. No chest pain.  No prior GI evaluation. Dehnes chest pain, shortness of breath.     Past Medical History  Diagnosis Date  . Palpitations   . VTE (venous thromboembolism)     post partum, post procedure, Protein C deficiency ruled out    Past Surgical History  Procedure Date  . D&c (other)   . Myringotomy and tubes (other)   . Extraction of wisdom teeth   . Laparoscopic tubal ligation     [facility]vath  . Tubal ligation          The following portions of the patient's history were reviewed and updated as appropriate: Allergies, current medications, and problem list.    Review of Systems:   12 Pt  review of systems was negative except those addressed in the HPI,     History   Social History  . Marital Status: Married    Spouse Name: N/A    Number of Children: N/A  . Years of Education: N/A   Occupational History  . Not on file.   Social History Main Topics  . Smoking status: Never Smoker   . Smokeless tobacco: Never Used  . Alcohol Use: No  . Drug Use: No  . Sexually Active: Not on file   Other Topics Concern  . 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     Objective:  BP  118/80  Pulse 120  Temp 98 F (36.7 C) (Oral)  Resp 16  Ht 5\' 4"  (1.626 m)  Wt 263 lb (119.296 kg)  BMI 45.14 kg/m2  SpO2 97%  LMP 02/24/2012  General appearance: alert, cooperative and appears stated age Ears: normal TM's and external ear canals both ears Throat: lips, mucosa, and tongue normal; teeth and gums normal Neck: no adenopathy, no carotid bruit, supple, symmetrical, trachea midline and thyroid not enlarged, symmetric, no tenderness/mass/nodules Back: symmetric, no curvature. ROM normal. No CVA tenderness. Lungs: clear to auscultation bilaterally Heart: regular rate and rhythm, S1, S2 normal, no murmur, click, rub or gallop Abdomen: soft, non-tender; bowel sounds normal; no masses,  no organomegaly Pulses: 2+ and symmetric Skin: Skin color, texture, turgor normal. No rashes or lesions Lymph nodes: Cervical, supraclavicular, and axillary nodes normal.  Assessment and Plan:  Tachycardia Etiology of sinus tach unclear,  Normal thyroid function in November. , with Q waves noted in leads II and III on today's EKG and no prior for comparison .  ECHO done several years ago was normal.   Pending evaluation of anemia,  Will start low dose beta blocker   Anemia Iron deficient.  GI evaluation recommended pending evaluation  of home hemoccult testing.   Fatigue In a patient with obesity and history of snoring.  Sleep study ordered.    Updated Medication List Outpatient Encounter Prescriptions as of 03/16/2012  Medication Sig Dispense Refill  . albuterol (PROVENTIL HFA;VENTOLIN HFA) 108 (90 BASE) MCG/ACT inhaler Inhale 2 puffs into the lungs every 6 (six) hours as needed for wheezing or shortness of breath.  3.7 g  1  . ferrous sulfate 325 (65 FE) MG tablet Take 1 tablet (325 mg total) by mouth daily.  30 tablet  3  . guaiFENesin-codeine (ROBITUSSIN AC) 100-10 MG/5ML syrup Take 5 mLs by mouth 2 (two) times daily as needed for cough.  240 mL  0  . sertraline (ZOLOFT) 50 MG tablet  Take 75 mg by mouth daily.      Marland Kitchen DISCONTD: chlorpheniramine-HYDROcodone (TUSSIONEX) 10-8 MG/5ML LQCR Take 5 mLs by mouth every 12 (twelve) hours as needed.  240 mL  0  . DISCONTD: sertraline (ZOLOFT) 100 MG tablet Take 100 mg by mouth daily.           Orders Placed This Encounter  Procedures  . CBC with Differential  . Comprehensive metabolic panel  . TSH  . Iron and TIBC  . Ferritin  . EKG 12-Lead  . Nocturnal polysomnography (NPSG)    No Follow-up on file.

## 2012-03-16 NOTE — Assessment & Plan Note (Addendum)
Iron deficient.  GI evaluation recommended pending evaluation of home hemoccult testing.

## 2012-03-17 ENCOUNTER — Ambulatory Visit: Payer: 59 | Admitting: Internal Medicine

## 2012-03-17 DIAGNOSIS — Z1211 Encounter for screening for malignant neoplasm of colon: Secondary | ICD-10-CM

## 2012-03-17 DIAGNOSIS — R195 Other fecal abnormalities: Secondary | ICD-10-CM

## 2012-03-17 DIAGNOSIS — D509 Iron deficiency anemia, unspecified: Secondary | ICD-10-CM

## 2012-03-17 LAB — CBC WITH DIFFERENTIAL/PLATELET
Basophils Absolute: 0.1 10*3/uL (ref 0.0–0.1)
Basophils Relative: 0.7 % (ref 0.0–3.0)
Eosinophils Absolute: 0.2 10*3/uL (ref 0.0–0.7)
Eosinophils Relative: 2.5 % (ref 0.0–5.0)
HCT: 39.5 % (ref 36.0–46.0)
Hemoglobin: 13.2 g/dL (ref 12.0–15.0)
Lymphocytes Relative: 24.4 % (ref 12.0–46.0)
Lymphs Abs: 2 10*3/uL (ref 0.7–4.0)
MCHC: 33.4 g/dL (ref 30.0–36.0)
MCV: 88.2 fl (ref 78.0–100.0)
Monocytes Absolute: 0.5 10*3/uL (ref 0.1–1.0)
Monocytes Relative: 6.2 % (ref 3.0–12.0)
Neutro Abs: 5.5 10*3/uL (ref 1.4–7.7)
Neutrophils Relative %: 66.2 % (ref 43.0–77.0)
Platelets: 194 10*3/uL (ref 150.0–400.0)
RBC: 4.48 Mil/uL (ref 3.87–5.11)
RDW: 14 % (ref 11.5–14.6)
WBC: 8.4 10*3/uL (ref 4.5–10.5)

## 2012-03-17 LAB — FECAL OCCULT BLOOD, IMMUNOCHEMICAL: Fecal Occult Bld: POSITIVE

## 2012-03-17 LAB — COMPREHENSIVE METABOLIC PANEL
ALT: 44 U/L — ABNORMAL HIGH (ref 0–35)
AST: 35 U/L (ref 0–37)
Albumin: 4 g/dL (ref 3.5–5.2)
Alkaline Phosphatase: 60 U/L (ref 39–117)
BUN: 16 mg/dL (ref 6–23)
CO2: 26 mEq/L (ref 19–32)
Calcium: 9.5 mg/dL (ref 8.4–10.5)
Chloride: 104 mEq/L (ref 96–112)
Creatinine, Ser: 0.8 mg/dL (ref 0.4–1.2)
GFR: 86.91 mL/min (ref >60.00)
Glucose, Bld: 86 mg/dL (ref 70–99)
Potassium: 3.8 mEq/L (ref 3.5–5.1)
Sodium: 138 mEq/L (ref 135–145)
Total Bilirubin: 0.5 mg/dL (ref 0.3–1.2)
Total Protein: 7.3 g/dL (ref 6.0–8.3)

## 2012-03-17 LAB — TSH: TSH: 1.97 u[IU]/mL (ref 0.35–5.50)

## 2012-03-17 LAB — IRON AND TIBC
%SAT: 15 % — ABNORMAL LOW (ref 20–55)
Iron: 52 ug/dL (ref 42–145)
TIBC: 340 ug/dL (ref 250–470)
UIBC: 288 ug/dL (ref 125–400)

## 2012-03-17 LAB — FERRITIN: Ferritin: 26.7 ng/mL (ref 10.0–291.0)

## 2012-03-17 NOTE — Addendum Note (Signed)
Addended by: Melody Comas L on: 03/17/2012 11:19 AM   Modules accepted: Orders

## 2012-03-18 ENCOUNTER — Other Ambulatory Visit: Payer: Self-pay | Admitting: Internal Medicine

## 2012-03-18 ENCOUNTER — Encounter: Payer: Self-pay | Admitting: Internal Medicine

## 2012-03-18 DIAGNOSIS — R7989 Other specified abnormal findings of blood chemistry: Secondary | ICD-10-CM

## 2012-03-18 NOTE — Addendum Note (Signed)
Addended by: Duncan Dull on: 03/18/2012 07:32 AM   Modules accepted: Orders

## 2012-03-23 ENCOUNTER — Telehealth: Payer: Self-pay | Admitting: Internal Medicine

## 2012-03-23 DIAGNOSIS — R9431 Abnormal electrocardiogram [ECG] [EKG]: Secondary | ICD-10-CM

## 2012-03-23 DIAGNOSIS — R000 Tachycardia, unspecified: Secondary | ICD-10-CM

## 2012-03-23 NOTE — Telephone Encounter (Signed)
Patient called in this morning in regards to her referrals for GI and cardiology. I saw the GI referral but didn't see a cardiologist referral please advise.

## 2012-03-23 NOTE — Telephone Encounter (Signed)
We were going to do things sequentially, (GI ,  Sleep study,  Cardiology 0 but you can go ahead and make referral

## 2012-03-27 ENCOUNTER — Ambulatory Visit (INDEPENDENT_AMBULATORY_CARE_PROVIDER_SITE_OTHER): Payer: 59 | Admitting: Cardiovascular Disease

## 2012-03-27 ENCOUNTER — Other Ambulatory Visit: Payer: Self-pay | Admitting: Internal Medicine

## 2012-03-27 ENCOUNTER — Encounter: Payer: Self-pay | Admitting: Cardiovascular Disease

## 2012-03-27 VITALS — BP 150/102 | HR 105 | Ht 64.0 in | Wt 263.5 lb

## 2012-03-27 DIAGNOSIS — R0602 Shortness of breath: Secondary | ICD-10-CM

## 2012-03-27 DIAGNOSIS — R000 Tachycardia, unspecified: Secondary | ICD-10-CM

## 2012-03-27 DIAGNOSIS — R002 Palpitations: Secondary | ICD-10-CM

## 2012-03-27 NOTE — Assessment & Plan Note (Signed)
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 Her recent labs were unremarkable including thyroid function. She does not use excessive amounts of caffeine. The possibilities include physical deconditioning or inappropriate sinus tachycardia. Also other etiologies such as cardiomyopathy and pulmonary hypertension will need to be excluded. I recommend a 48-hour Holter monitor to see if she has any other associated arrhythmia. She will likely require treatment with a beta blocker to suppress this and try to improve her physical conditioning.

## 2012-03-27 NOTE — Progress Notes (Signed)
HPI  This is a 41 year old female who is here today for evaluation of tachycardia, palpitations and dyspnea. The patient was seen in 2009 by Dr. Eden Emms for palpitations and dyspnea during pregnancy at that time. She had a Holter monitor done which showed PVCs. Echocardiogram was within normal limits. The patient reports fast heart rate for many years which has been worsening lately. This has been associated with dyspnea with minimal activities without chest pain. She cannot do significant amount of exercise without getting out of breath. She denies orthopnea, PND or lower extremity edema. She is not aware of history of sleep apnea but she does know about. She had recent labs done which were within normal limits. She does have a deficiency but she is not anemic anymore. Thyroid function was normal. She drinks one cup of coffee a day only.  No Known Allergies   Current Outpatient Prescriptions on File Prior to Visit  Medication Sig Dispense Refill  . ferrous sulfate 325 (65 FE) MG tablet Take 1 tablet (325 mg total) by mouth daily.  30 tablet  3  . sertraline (ZOLOFT) 50 MG tablet Take 75 mg by mouth daily.      Marland Kitchen albuterol (PROVENTIL HFA;VENTOLIN HFA) 108 (90 BASE) MCG/ACT inhaler Inhale 2 puffs into the lungs every 6 (six) hours as needed for wheezing or shortness of breath.  3.7 g  1  . guaiFENesin-codeine (ROBITUSSIN AC) 100-10 MG/5ML syrup Take 5 mLs by mouth 2 (two) times daily as needed for cough.  240 mL  0     Past Medical History  Diagnosis Date  . Palpitations   . VTE (venous thromboembolism)     post partum, post procedure, Protein C deficiency ruled out     Past Surgical History  Procedure Date  . D&c (other)   . Myringotomy and tubes (other)   . Extraction of wisdom teeth   . Laparoscopic tubal ligation     [place]vath  . Tubal ligation      Family History  Problem Relation Age of Onset  . COPD Mother   . Cancer Mother   . Early death Mother 61    Died at [facility] 2011/06/22 . Hypertension Father   . Kidney disease Father     renal calculi  . Heart disease Father 79    4 vessel CABG   . Cancer Maternal Grandmother     breast cancer     History   Social History  . Marital Status: Married    Spouse Name: N/A    Number of Children: N/A  . Years of Education: N/A   Occupational History  . Not on file.   Social History Main Topics  . Smoking status: Never Smoker   . Smokeless tobacco: Never Used  . Alcohol Use: No  . Drug Use: No  . Sexually Active: Not on file   Other Topics Concern  . 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      ROS Constitutional: Negative for fever, chills, diaphoresis, activity change, appetite change.  HENT: Negative for hearing loss, nosebleeds, congestion, sore throat, facial swelling, drooling, trouble swallowing, neck pain, voice change, sinus pressure and tinnitus.  Eyes: Negative for photophobia, pain, discharge and visual disturbance.  Respiratory: Negative for apnea, cough, chest tightness and wheezing.  Cardiovascular: Negative for chest pain and leg swelling.  Gastrointestinal: Negative  for nausea, vomiting, abdominal pain, diarrhea, constipation, blood in stool and abdominal distention.  Genitourinary: Negative for dysuria, urgency, frequency, hematuria and decreased urine volume.  Musculoskeletal: Negative for myalgias, back pain, joint swelling, arthralgias and gait problem.  Skin: Negative for color change, pallor, rash and wound.  Neurological: Negative for dizziness, tremors, seizures, syncope, speech difficulty, weakness, light-headedness, numbness and headaches.  Psychiatric/Behavioral: Negative for suicidal ideas, hallucinations, behavioral problems and agitation. The patient is not nervous/anxious.     PHYSICAL EXAM   BP 150/102  Pulse 105  Ht 5\' 4"  (1.626 m)  Wt 263 lb 8 oz  (119.523 kg)  BMI 45.23 kg/m2  LMP 02/24/2012 Constitutional: She is oriented to person, place, and time. She appears well-developed and well-nourished. No distress.  HENT: No nasal discharge.  Head: Normocephalic and atraumatic.  Eyes: Pupils are equal and round. Right eye exhibits no discharge. Left eye exhibits no discharge.  Neck: Normal range of motion. Neck supple. No JVD present. No thyromegaly present.  Cardiovascular: Normal rate, regular rhythm, normal heart sounds. Exam reveals no gallop and no friction rub. No murmur heard.  Pulmonary/Chest: Effort normal and breath sounds normal. No stridor. No respiratory distress. She has no wheezes. She has no rales. She exhibits no tenderness.  Abdominal: Soft. Bowel sounds are normal. She exhibits no distension. There is no tenderness. There is no rebound and no guarding.  Musculoskeletal: Normal range of motion. She exhibits no edema and no tenderness.  Neurological: She is alert and oriented to person, place, and time. Coordination normal.  Skin: Skin is warm and dry. No rash noted. She is not diaphoretic. No erythema. No pallor.  Psychiatric: She has a normal mood and affect. Her behavior is normal. Judgment and thought content normal.     [id number]  Tachycardia  Normal ECG other than rate.   ASSESSMENT AND PLAN

## 2012-03-27 NOTE — Patient Instructions (Addendum)
Your physician has requested that you have an echocardiogram. Echocardiography is a painless test that uses sound waves to create images of your heart. It provides your doctor with information about the size and shape of your heart and how well your heart's chambers and valves are working. This procedure takes approximately one hour. There are no restrictions for this procedure.  Your physician has recommended that you wear a holter monitor. Holter monitors are medical devices that record the heart's electrical activity. Doctors most often use these monitors to diagnose arrhythmias. Arrhythmias are problems with the speed or rhythm of the heartbeat. The monitor is a small, portable device. You can wear one while you do your normal daily activities. This is usually used to diagnose what is causing palpitations/syncope (passing out).  Follow up after tests.  

## 2012-03-27 NOTE — Assessment & Plan Note (Signed)
I recommend an echocardiogram for further evaluation.

## 2012-03-31 NOTE — Telephone Encounter (Signed)
Patient was scheduled for Cardiology on 03/27/12 see referral information, patient did go to that.

## 2012-04-06 ENCOUNTER — Other Ambulatory Visit: Payer: Self-pay | Admitting: Cardiovascular Disease

## 2012-04-06 ENCOUNTER — Telehealth: Payer: Self-pay

## 2012-04-06 DIAGNOSIS — R002 Palpitations: Secondary | ICD-10-CM

## 2012-04-06 NOTE — Telephone Encounter (Signed)
LMTCB re: normal holter results

## 2012-04-13 ENCOUNTER — Other Ambulatory Visit: Payer: Self-pay

## 2012-04-13 ENCOUNTER — Ambulatory Visit (INDEPENDENT_AMBULATORY_CARE_PROVIDER_SITE_OTHER): Payer: 59 | Admitting: Cardiology

## 2012-04-13 DIAGNOSIS — R0602 Shortness of breath: Secondary | ICD-10-CM

## 2012-04-21 ENCOUNTER — Ambulatory Visit (INDEPENDENT_AMBULATORY_CARE_PROVIDER_SITE_OTHER): Payer: 59 | Admitting: Cardiovascular Disease

## 2012-04-21 ENCOUNTER — Encounter: Payer: Self-pay | Admitting: Cardiovascular Disease

## 2012-04-21 VITALS — BP 158/92 | HR 84 | Ht 64.0 in | Wt 258.5 lb

## 2012-04-21 DIAGNOSIS — R0602 Shortness of breath: Secondary | ICD-10-CM

## 2012-04-21 DIAGNOSIS — R002 Palpitations: Secondary | ICD-10-CM

## 2012-04-21 MED ORDER — DILTIAZEM HCL ER COATED BEADS 120 MG PO CP24: 120.0000 mg | ORAL_CAPSULE | Freq: Every day | ORAL | Status: DC

## 2012-04-21 NOTE — Telephone Encounter (Signed)
Pt has appt today Will review at that time

## 2012-04-21 NOTE — Patient Instructions (Addendum)
Start Diltiazem ER 120 mg once daily.  Start an exercise program.  Follow up in 2 months.

## 2012-04-22 ENCOUNTER — Other Ambulatory Visit: Payer: 59

## 2012-04-28 NOTE — Assessment & Plan Note (Signed)
I suspect that there is likely a component of physical deconditioning. Echocardiogram was normal. I recommend starting an exercise program.

## 2012-04-28 NOTE — Progress Notes (Signed)
HPI  This is a 41 year old female who is here today for a followup visit regarding  tachycardia, palpitations and dyspnea. The patient was seen in 2009 by Dr. Eden Emms for palpitations and dyspnea during pregnancy at that time. She had a Holter monitor done which showed PVCs. Echocardiogram was within normal limits. The patient reports fast heart rate for many years which has been worsening lately. This has been associated with dyspnea with minimal activities without chest pain. She cannot do significant amount of exercise without getting out of breath. She denies orthopnea, PND or lower extremity edema. She is not aware of history of sleep apnea but she does know about. She had recent labs done which were within normal limits. She does have a deficiency but she is not anemic anymore. Thyroid function was normal. She drinks one cup of coffee a day only. She underwent cardiac evaluation which included an echocardiogram. This showed normal LV systolic function with no significant valvular abnormalities or pulmonary hypertension. Holter monitor showed sinus rhythm with occasional PACs and sinus tachycardia. No other significant arrhythmias were noted. The patient continues to complain of exertional palpitations.  No Known Allergies   Current Outpatient Prescriptions on File Prior to Visit  Medication Sig Dispense Refill  . albuterol (PROVENTIL HFA;VENTOLIN HFA) 108 (90 BASE) MCG/ACT inhaler Inhale 2 puffs into the lungs every 6 (six) hours as needed for wheezing or shortness of breath.  3.7 g  1  . ferrous sulfate 325 (65 FE) MG tablet TAKE ONE TABLET BY MOUTH ONE TIME DAILY  30 tablet  2  . guaiFENesin-codeine (ROBITUSSIN AC) 100-10 MG/5ML syrup Take 5 mLs by mouth 2 (two) times daily as needed for cough.  240 mL  0  . sertraline (ZOLOFT) 50 MG tablet Take 75 mg by mouth daily.      Marland Kitchen diltiazem (CARDIZEM CD) 120 MG 24 hr capsule Take 1 capsule (120 mg total) by mouth daily.  30 capsule  6     Past  Medical History  Diagnosis Date  . Palpitations   . VTE (venous thromboembolism)     post partum, post procedure, Protein C deficiency ruled out     Past Surgical History  Procedure Date  . D&c (other)   . Myringotomy and tubes (other)   . Extraction of wisdom teeth   . Laparoscopic tubal ligation     Horvath  . Tubal ligation      Family History  Problem Relation Age of Onset  . COPD Mother   . Cancer Mother   . Early death Mother 37    Died at [facility] 06-07-2011 . Hypertension Father   . Kidney disease Father     renal calculi  . Heart disease Father 72    4 vessel CABG   . Cancer Maternal Grandmother     breast cancer     History   Social History  . Marital Status: Married    Spouse Name: N/A    Number of Children: N/A  . Years of Education: N/A   Occupational History  . Not on file.   Social History Main Topics  . Smoking status: Never Smoker   . Smokeless tobacco: Never Used  . Alcohol Use: No  . Drug Use: No  . Sexually Active: Not on file   Other Topics Concern  . 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           PHYSICAL EXAM   BP 158/92  Pulse 84  Ht 5\' 4"  (1.626 m)  Wt 117.255 kg (258 lb 8 oz)  BMI 44.37 kg/m2 Constitutional: She is oriented to person, place, and time. She appears well-developed and well-nourished. No distress.  HENT: No nasal discharge.  Head: Normocephalic and atraumatic.  Eyes: Pupils are equal and round. Right eye exhibits no discharge. Left eye exhibits no discharge.  Neck: Normal range of motion. Neck supple. No JVD present. No thyromegaly present.  Cardiovascular: Normal rate, regular rhythm, normal heart sounds. Exam reveals no gallop and no friction rub. No murmur heard.  Pulmonary/Chest: Effort normal and breath sounds normal. No stridor. No respiratory distress. She has no wheezes. She has no  rales. She exhibits no tenderness.  Abdominal: Soft. Bowel sounds are normal. She exhibits no distension. There is no tenderness. There is no rebound and no guarding.  Musculoskeletal: Normal range of motion. She exhibits no edema and no tenderness.  Neurological: She is alert and oriented to person, place, and time. Coordination normal.  Skin: Skin is warm and dry. No rash noted. She is not diaphoretic. No erythema. No pallor.  Psychiatric: She has a normal mood and affect. Her behavior is normal. Judgment and thought content normal.       ASSESSMENT AND PLAN

## 2012-04-28 NOTE — Assessment & Plan Note (Signed)
Holter monitor showed occasional PACs and sinus tachycardia without any other significant arrhythmia. The patient seems to be symptomatic enough to warrant some form of medical treatment. Thus, I will start her on diltiazem extended release 120 mg once daily.

## 2012-05-08 ENCOUNTER — Telehealth: Payer: Self-pay

## 2012-05-08 NOTE — Telephone Encounter (Signed)
Pt needs cardiac clearance for colonoscopy scheduled for 12/3 Ok to clear?

## 2012-05-11 ENCOUNTER — Telehealth: Payer: Self-pay | Admitting: Internal Medicine

## 2012-05-11 NOTE — Telephone Encounter (Signed)
Patient notified by telephone that Dr. Lorin Picket will see her Tuesday morning at 9:00.

## 2012-05-11 NOTE — Telephone Encounter (Signed)
Onset 04/20/12 Patient states she has had left sided pain under the ribs that is worsening and pain increases to 4-6 on pain scale with inhalation.  Afebrile.  All emergent symptoms ruled out per Breathing Problems protocol with exception " New or worsening breathing problems no responding to treatment or no treatment plan."  PER DISPOSITION SEE PROVIDER WITHIN 24 HOURS, PLEASE F/U WITH PATIENT WITH APPT. NEEDS.  THANK YOU.

## 2012-05-11 NOTE — Telephone Encounter (Signed)
She is at low risk for colonoscopy. 

## 2012-05-12 ENCOUNTER — Encounter: Payer: Self-pay | Admitting: Internal Medicine

## 2012-05-12 ENCOUNTER — Ambulatory Visit (INDEPENDENT_AMBULATORY_CARE_PROVIDER_SITE_OTHER): Payer: 59 | Admitting: Internal Medicine

## 2012-05-12 ENCOUNTER — Other Ambulatory Visit: Payer: Self-pay | Admitting: Internal Medicine

## 2012-05-12 VITALS — BP 107/65 | HR 89 | Temp 98.7°F | Wt 258.0 lb

## 2012-05-12 DIAGNOSIS — R109 Unspecified abdominal pain: Secondary | ICD-10-CM

## 2012-05-12 NOTE — Telephone Encounter (Signed)
Letter faxed to [facility]

## 2012-05-12 NOTE — Patient Instructions (Addendum)
It was nice meeting you today.  I am sorry you have not been feeling well.  I am going to check some labs and and abdominal ultrasound.  We will notify you of the results once they become available.

## 2012-05-13 ENCOUNTER — Encounter: Payer: Self-pay | Admitting: Internal Medicine

## 2012-05-13 ENCOUNTER — Other Ambulatory Visit: Payer: Self-pay | Admitting: Internal Medicine

## 2012-05-13 ENCOUNTER — Ambulatory Visit: Payer: Self-pay | Admitting: Internal Medicine

## 2012-05-13 DIAGNOSIS — R109 Unspecified abdominal pain: Secondary | ICD-10-CM

## 2012-05-13 LAB — CBC WITH DIFFERENTIAL
Basophils Absolute: 0 10*3/uL (ref 0.0–0.2)
Basos: 0 % (ref 0–3)
Eos: 2 % (ref 0–5)
Eosinophils Absolute: 0.1 10*3/uL (ref 0.0–0.4)
HCT: 42.1 % (ref 34.0–46.6)
Hemoglobin: 14.1 g/dL (ref 11.1–15.9)
Immature Grans (Abs): 0 10*3/uL (ref 0.0–0.1)
Immature Granulocytes: 0 % (ref 0–2)
Lymphocytes Absolute: 1.9 10*3/uL (ref 0.7–3.1)
Lymphs: 32 % (ref 14–46)
MCH: 29.9 pg (ref 26.6–33.0)
MCHC: 33.5 g/dL (ref 31.5–35.7)
MCV: 89 fL (ref 79–97)
Monocytes Absolute: 0.4 10*3/uL (ref 0.1–0.9)
Monocytes: 7 % (ref 4–12)
Neutrophils Absolute: 3.5 10*3/uL (ref 1.4–7.0)
Neutrophils Relative %: 59 % (ref 40–74)
Platelets: 189 10*3/uL (ref 155–379)
RBC: 4.71 x10E6/uL (ref 3.77–5.28)
RDW: 14.4 % (ref 12.3–15.4)
WBC: 6 10*3/uL (ref 3.4–10.8)

## 2012-05-13 LAB — MICROSCOPIC EXAMINATION: Epithelial Cells (non renal): >10 /hpf — AB (ref 0–10)

## 2012-05-13 LAB — URINALYSIS, ROUTINE W REFLEX MICROSCOPIC
Bilirubin, UA: NEGATIVE
Glucose, UA: NEGATIVE
Ketones, UA: NEGATIVE
Nitrite, UA: NEGATIVE
Protein, UA: NEGATIVE
RBC, UA: NEGATIVE
Specific Gravity, UA: 1.027 (ref 1.005–1.030)
Urobilinogen, Ur: 0.2 mg/dL (ref 0.0–1.9)
pH, UA: 5.5 (ref 5.0–7.5)

## 2012-05-13 LAB — BASIC METABOLIC PANEL
BUN/Creatinine Ratio: 16 (ref 9–23)
BUN: 12 mg/dL (ref 6–24)
CO2: 23 mmol/L (ref 19–28)
Calcium: 9.6 mg/dL (ref 8.7–10.2)
Chloride: 103 mmol/L (ref 97–108)
Creatinine, Ser: 0.74 mg/dL (ref 0.57–1.00)
GFR calc Af Amer: 115 mL/min/{1.73_m2} (ref >59)
GFR calc non Af Amer: 100 mL/min/{1.73_m2} (ref >59)
Glucose: 81 mg/dL (ref 65–99)
Potassium: 4.1 mmol/L (ref 3.5–5.2)
Sodium: 142 mmol/L (ref 134–144)

## 2012-05-13 LAB — HEPATIC FUNCTION PANEL
ALT: 52 IU/L — ABNORMAL HIGH (ref 0–32)
AST: 39 IU/L (ref 0–40)
Albumin: 4.3 g/dL (ref 3.5–5.5)
Alkaline Phosphatase: 72 IU/L (ref 42–107)
Bilirubin, Direct: 0.09 mg/dL (ref 0.00–0.40)
Total Bilirubin: 0.3 mg/dL (ref 0.0–1.2)
Total Protein: 7 g/dL (ref 6.0–8.5)

## 2012-05-13 LAB — AMYLASE: Amylase: 65 U/L (ref 31–124)

## 2012-05-13 LAB — LIPASE: Lipase: 44 U/L (ref 0–59)

## 2012-05-13 NOTE — Progress Notes (Signed)
  Subjective:    Patient ID: Glennon Mac, female    DOB: 1970-10-24, 41 y.o.   MRN: [id number]  HPI 41 year old female with past history of palpitations and anemia who comes in today with concerns regarding persistent LUQ pain.  She states that the pain started 3-4 weeks ago. It is relieved by lying down and stretching her arm over her head. It hurts to take a deep breath. Nausea started 2 weeks ago.  She has been eating and drinking, but states that if she eats too much this aggravates the pain as well.  Eating small meals help the nausea.  No bowel change associated with the pain. Specifically she denies any diarrhea or constipation.  No known injury or trauma to the area. She has not noticed any rash.  Past Medical History  Diagnosis Date  . Palpitations   . VTE (venous thromboembolism)     post partum, post procedure, Protein C deficiency ruled out    Review of Systems Patient denies any headache, lightheadedness or dizziness.  No chest pain, tightness or palpitations. No increased shortness of breath, cough or congestion.  No significant acid reflux.  May have noticed a minimal amount.   Nausea as outlined.  No vomiting.  Pain localized to the LUQ.  No other abdominal pain or cramping.   No bowel change, such as diarrhea, constipation, BRBPR or melana.  No urine change.  No vaginal problems.  No back pain.      Objective:   Physical Exam Filed Vitals:   05/12/12 0924  BP: 107/65  Pulse: 89  Temp: 98.7 F (13.82 C)   41 year old female in no acute distress.   NECK:  Supple, nontender.    HEART:  Appears to be regular. LUNGS:  Without crackles or wheezing audible.  Respirations even and unlabored.  Good breath sounds bilaterally.   RADIAL PULSE:  Equal bilaterally.  ABDOMEN:  Soft.  Tenderness noted in the LUQ - with palpation.  No rebound or guarding.  Pain localized to this area.  No audible abdominal bruit.  Bowel sounds present and normal.  RIBS:  No tenderness to palpation  over the lower anterior ribs.                     Assessment & Plan:  LUQ PAIN.  Pain reproducible on exam.  Does not appear to involve the rib cage.  Localized to the left upper quadrant.  She is also experiencing some nausea. Unclear if this is related to the pain. She is currently undergoing a GI evaluation for iron deficient anemia. Other than the nausea, she is having no significant upper GI complaints. She has noted possible (minimal) acid reflux.  She is planning to have an upper endoscopy and colonoscopy in December (by Dr Marva Panda).  Will check some routine labs including a CBC and a liver panel. We'll also check an amylase and lipase.  Will schedule an abdominal ultrasound - given the persistence.   Start over-the-counter Zantac.  Will avoid antiinflammatories.  Can take Tylenol ES as directed.  Further w/up pending results of above.  Pt comfortable with this plan.  She was informed if she had any change or worsening symptoms, she was to be reevaluated.    CARDIOVASCULAR.  Currently asymptomatic.

## 2012-05-14 ENCOUNTER — Other Ambulatory Visit (INDEPENDENT_AMBULATORY_CARE_PROVIDER_SITE_OTHER): Payer: 59

## 2012-05-14 ENCOUNTER — Telehealth: Payer: Self-pay | Admitting: Internal Medicine

## 2012-05-14 DIAGNOSIS — R109 Unspecified abdominal pain: Secondary | ICD-10-CM

## 2012-05-14 DIAGNOSIS — R7989 Other specified abnormal findings of blood chemistry: Secondary | ICD-10-CM

## 2012-05-14 LAB — POCT URINALYSIS DIPSTICK
Bilirubin, UA: NEGATIVE
Blood, UA: NEGATIVE
Glucose, UA: NEGATIVE
Ketones, UA: NEGATIVE
Leukocytes, UA: NEGATIVE
Nitrite, UA: NEGATIVE
Protein, UA: NEGATIVE
Spec Grav, UA: >=1.03
Urobilinogen, UA: 0.2
pH, UA: 5.5

## 2012-05-14 NOTE — Telephone Encounter (Signed)
When pt was here the other day she left a urine sample.  I would like for her to come back in and do another urine sample - so that we can send for culture.  Unable to culture last urine.  Want to make sure no infection.  Thanks.  I will place order for u/a and culture.  Thanks.

## 2012-05-14 NOTE — Telephone Encounter (Signed)
Pt coming in today at 4:00pm for repeat UA and culture.

## 2012-05-14 NOTE — Addendum Note (Signed)
Addended by: Mauri Reading on: 05/14/2012 04:41 PM   Modules accepted: Orders

## 2012-05-15 LAB — URINALYSIS, ROUTINE W REFLEX MICROSCOPIC
Bilirubin Urine: NEGATIVE
Hgb urine dipstick: NEGATIVE
Ketones, ur: NEGATIVE
Leukocytes, UA: NEGATIVE
Nitrite: NEGATIVE
Specific Gravity, Urine: >=1.03 (ref 1.000–1.030)
Total Protein, Urine: NEGATIVE
Urine Glucose: NEGATIVE
Urobilinogen, UA: 0.2 (ref 0.0–1.0)
pH: 5.5 (ref 5.0–8.0)

## 2012-05-15 LAB — HEPATIC FUNCTION PANEL
ALT: 41 U/L — ABNORMAL HIGH (ref 0–35)
AST: 25 U/L (ref 0–37)
Albumin: 3.6 g/dL (ref 3.5–5.2)
Alkaline Phosphatase: 59 U/L (ref 39–117)
Bilirubin, Direct: 0.1 mg/dL (ref 0.0–0.3)
Total Bilirubin: 0.4 mg/dL (ref 0.3–1.2)
Total Protein: 7.1 g/dL (ref 6.0–8.3)

## 2012-05-15 LAB — HEPATITIS B CORE ANTIBODY, TOTAL: Hep B Core Total Ab: NEGATIVE

## 2012-05-15 LAB — HEPATITIS C ANTIBODY: HCV Ab: NEGATIVE

## 2012-05-15 LAB — HEPATITIS B SURFACE ANTIBODY,QUALITATIVE: Hep B S Ab: NEGATIVE

## 2012-05-15 LAB — HEPATITIS B SURFACE ANTIGEN: Hepatitis B Surface Ag: NEGATIVE

## 2012-05-16 LAB — URINE CULTURE: Colony Count: >=100000

## 2012-05-20 ENCOUNTER — Ambulatory Visit (INDEPENDENT_AMBULATORY_CARE_PROVIDER_SITE_OTHER): Payer: 59 | Admitting: Internal Medicine

## 2012-05-20 ENCOUNTER — Encounter: Payer: Self-pay | Admitting: Internal Medicine

## 2012-05-20 VITALS — BP 136/84 | HR 87 | Temp 98.4°F | Ht 64.0 in | Wt 260.0 lb

## 2012-05-20 DIAGNOSIS — K8020 Calculus of gallbladder without cholecystitis without obstruction: Secondary | ICD-10-CM

## 2012-05-20 DIAGNOSIS — R1012 Left upper quadrant pain: Secondary | ICD-10-CM

## 2012-05-20 DIAGNOSIS — R5383 Other fatigue: Secondary | ICD-10-CM

## 2012-05-20 DIAGNOSIS — R5381 Other malaise: Secondary | ICD-10-CM

## 2012-05-20 DIAGNOSIS — R110 Nausea: Secondary | ICD-10-CM

## 2012-05-20 DIAGNOSIS — R002 Palpitations: Secondary | ICD-10-CM

## 2012-05-20 DIAGNOSIS — R0602 Shortness of breath: Secondary | ICD-10-CM

## 2012-05-20 MED ORDER — CYCLOBENZAPRINE HCL 10 MG PO TABS: 10.0000 mg | ORAL_TABLET | Freq: Three times a day (TID) | ORAL | Status: DC | PRN

## 2012-05-20 NOTE — Progress Notes (Signed)
Patient ID: Maria Moore, female   DOB: [birth year], 41 y.o.   MRN: [id number]  Patient Active Problem List  Diagnosis  . PALPITATIONS  . History of DVT of lower extremity  . S/P tubal ligation  . VTE (venous thromboembolism)  . Tachycardia  . Fatigue  . Shortness of breath  . Obesity, Class III, BMI 40-49.9 (morbid obesity)  . Nausea in adult    Subjective:  CC:   Chief Complaint  Patient presents with  . Follow-up    HPI:   Maria Moore a 41 y.o. female who presents with a 6  weeks history of persistent daily LUQ pain alleviated by lying flat and positioning arms above her head.  Accompanied by nausea which has been persistent.  She was referred to GI in late August for endoscopic evaluation for positive FOBT.  I do not have the results of that evaluation. She has had an  Ultrasound on oct 16 at [facility] which reported splenomegaly and no evidence of cholecystitis.  No mention of it on the 2010 CT which was done at [facility].  She has a history of prior DVT but ruled out for Protein C deficiency at that time.    Past Medical History  Diagnosis Date  . Palpitations   . VTE (venous thromboembolism)     post partum, post procedure, Protein C deficiency ruled out    Past Surgical History  Procedure Date  . D&c (other)   . Myringotomy and tubes (other)   . Extraction of wisdom teeth   . Laparoscopic tubal ligation     [place]vath  . Tubal ligation          The following portions of the patient's history were reviewed and updated as appropriate: Allergies, current medications, and problem list.    Review of Systems:   12 Pt  review of systems was negative except those addressed in the HPI,     History   Social History  . Marital Status: Married    Spouse Name: N/A    Number of Children: N/A  . Years of Education: N/A   Occupational History  . Not on file.   Social History Main Topics  . Smoking status: Never Smoker   . Smokeless tobacco: Never Used    . Alcohol Use: No  . Drug Use: No  . Sexually Active: Not on file   Other Topics Concern  . 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     Objective:  BP 136/84  Pulse 87  Temp 98.4 F (36.9 C)  Ht 5\' 4"  (1.626 m)  Wt 260 lb (117.935 kg)  BMI 44.63 kg/m2  SpO2 99%  LMP 04/27/2012  General appearance: alert, cooperative and appears stated age Ears: normal TM's and external ear canals both ears Throat: lips, mucosa, and tongue normal; teeth and gums normal Neck: no adenopathy, no carotid bruit, supple, symmetrical, trachea midline and thyroid not enlarged, symmetric, no tenderness/mass/nodules Back: symmetric, no curvature. ROM normal. No CVA tenderness. Lungs: clear to auscultation bilaterally Heart: regular rate and rhythm, S1, S2 normal, no murmur, click, rub or gallop Abdomen: soft, non-tender; bowel sounds normal; no masses,  no organomegaly Pulses: 2+ and symmetric Skin: Skin color, texture, turgor normal. No rashes or lesions Lymph nodes: Cervical, supraclavicular, and axillary nodes normal.  Assessment and Plan:  Shortness of breath Concurrent symptoms of palpitations and  dyspnea were evaluated by cardiology with an echocardiogram which was normal. Given her morbid obesity and absence of any pulmonary disease, her symptoms were presumed to be due to deconditioning. She was encouraged to exercise and lose weight.  PALPITATIONS Evaluated in the distant past by Dr. 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 Recent repeat cardiology evaluation was noncontributory. Patient will need to have sleep study due to morbid obesity,  And fatigue. Dr. Kirke Corin has started her on diltiazem for control of PVCs.  Fatigue She very mild iron deficiency without anemia.  She is deconditioned and may be suffering from sleep apnea which has not been evaluated yet due to other multiple symptoms  which needed  Sleep study will be ordered once her nausea is relieved.  Nausea in adult Persistent for the last 6 weeks. Accompanied by left lower quadrant pain. She has a persistent mildly elevated ALT with no signs of viral hepatitis by recent serologies. She does a calcified gallstone in her gallbladder which was noted on recent CT scan done due to [facility] his ultrasound suggesting that she had splenomegaly. Fortunately the spleen size was normal so I will not pursue workup for recurrent thrombosis in the setting of a normal CBC. Marland Kitchen Recent GI evaluation for heme positive stool  has been done but results not available for review. We'll start PPI and schedule HIDA scan .    Updated Medication List Outpatient Encounter Prescriptions as of 05/20/2012  Medication Sig Dispense Refill  . cyclobenzaprine (FLEXERIL) 10 MG tablet Take 1 tablet (10 mg total) by mouth 3 (three) times daily as needed for muscle spasms.  30 tablet  1  . diltiazem (CARDIZEM CD) 120 MG 24 hr capsule Take 1 capsule (120 mg total) by mouth daily.  30 capsule  6  . ferrous sulfate 325 (65 FE) MG tablet TAKE ONE TABLET BY MOUTH ONE TIME DAILY  30 tablet  2  . pantoprazole (PROTONIX) 40 MG tablet Take 1 tablet (40 mg total) by mouth daily.  30 tablet  3  . promethazine (PHENERGAN) 12.5 MG tablet Take 1 tablet (12.5 mg total) by mouth every 8 (eight) hours as needed for nausea.  20 tablet  0  . sertraline (ZOLOFT) 50 MG tablet Take 75 mg by mouth daily.       No facility-administered encounter medications on file as of 05/20/2012.

## 2012-05-22 ENCOUNTER — Ambulatory Visit (HOSPITAL_COMMUNITY)
Admission: RE | Admit: 2012-05-22 | Discharge: 2012-05-22 | Disposition: A | Discharge status: Home / Self Care | Payer: 59 | Source: Ambulatory Visit | Attending: Internal Medicine | Admitting: Internal Medicine

## 2012-05-22 DIAGNOSIS — K7689 Other specified diseases of liver: Secondary | ICD-10-CM | POA: Insufficient documentation

## 2012-05-22 DIAGNOSIS — K8020 Calculus of gallbladder without cholecystitis without obstruction: Secondary | ICD-10-CM | POA: Insufficient documentation

## 2012-05-22 DIAGNOSIS — R1012 Left upper quadrant pain: Secondary | ICD-10-CM | POA: Insufficient documentation

## 2012-05-22 MED ORDER — IOHEXOL 300 MG/ML  SOLN
100.0000 mL | Freq: Once | INTRAMUSCULAR | Status: AC | PRN
  Administered 2012-05-22: 100 mL via INTRAVENOUS

## 2012-05-23 ENCOUNTER — Encounter: Payer: Self-pay | Admitting: Internal Medicine

## 2012-05-23 DIAGNOSIS — R110 Nausea: Secondary | ICD-10-CM | POA: Insufficient documentation

## 2012-05-23 MED ORDER — PROMETHAZINE HCL 12.5 MG PO TABS: 12.5000 mg | ORAL_TABLET | Freq: Three times a day (TID) | ORAL | Status: DC | PRN

## 2012-05-23 MED ORDER — PANTOPRAZOLE SODIUM 40 MG PO TBEC: 40.0000 mg | DELAYED_RELEASE_TABLET | Freq: Every day | ORAL | Status: DC

## 2012-05-23 NOTE — Assessment & Plan Note (Signed)
Persistent for the last 6 weeks. Accompanied by left lower quadrant pain. She has a persistent mildly elevated ALT with no signs of viral hepatitis by recent serologies. She does a calcified gallstone in her gallbladder which was noted on recent CT scan done due to [facility] his ultrasound suggesting that she had splenomegaly. Fortunately the spleen size was normal so I will not pursue workup for recurrent thrombosis in the setting of a normal CBC. Marland Kitchen Recent GI evaluation for heme positive stool  has been done but results not available for review. We'll start PPI and schedule HIDA scan .

## 2012-05-23 NOTE — Assessment & Plan Note (Addendum)
She very mild iron deficiency without anemia.  She is deconditioned and may be suffering from sleep apnea which has not been evaluated yet due to other multiple symptoms which needed  Sleep study will be ordered once her nausea is relieved.

## 2012-05-23 NOTE — Assessment & Plan Note (Signed)
Concurrent symptoms of palpitations and dyspnea were evaluated by cardiology with an echocardiogram which was normal. Given her morbid obesity and absence of any pulmonary disease, her symptoms were presumed to be due to deconditioning. She was encouraged to exercise and lose weight.

## 2012-05-23 NOTE — Assessment & Plan Note (Addendum)
Evaluated in the distant past by Dr. 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 Recent repeat cardiology evaluation was noncontributory. Patient will need to have sleep study due to morbid obesity,  And fatigue. Dr. Kirke Corin has started her on diltiazem for control of PVCs.

## 2012-06-06 ENCOUNTER — Emergency Department: Payer: Self-pay | Admitting: Emergency Medicine

## 2012-06-08 ENCOUNTER — Encounter: Payer: Self-pay | Admitting: Internal Medicine

## 2012-06-18 ENCOUNTER — Encounter: Payer: Self-pay | Admitting: Internal Medicine

## 2012-06-22 ENCOUNTER — Ambulatory Visit (INDEPENDENT_AMBULATORY_CARE_PROVIDER_SITE_OTHER): Payer: 59 | Admitting: Cardiovascular Disease

## 2012-06-22 ENCOUNTER — Encounter: Payer: Self-pay | Admitting: Cardiovascular Disease

## 2012-06-22 VITALS — BP 120/98 | HR 73 | Ht 65.0 in | Wt 259.8 lb

## 2012-06-22 DIAGNOSIS — R0602 Shortness of breath: Secondary | ICD-10-CM

## 2012-06-22 DIAGNOSIS — R5383 Other fatigue: Secondary | ICD-10-CM

## 2012-06-22 DIAGNOSIS — R002 Palpitations: Secondary | ICD-10-CM

## 2012-06-22 NOTE — Patient Instructions (Addendum)
Continue same medications.   Follow up as needed.  

## 2012-06-22 NOTE — Progress Notes (Signed)
HPI  This is a 41 year old female who is here today for a followup visit regarding  tachycardia, palpitations and dyspnea. The patient was seen in 2009 by Dr. Eden Emms for palpitations and dyspnea during pregnancy at that time. She had a Holter monitor done which showed PVCs. Echocardiogram was within normal limits. The patient reports fast heart rate for many years which has been worsening lately. This has been associated with dyspnea with minimal activities without chest pain.  She underwent cardiac evaluation which included an echocardiogram. This showed normal LV systolic function with no significant valvular abnormalities or pulmonary hypertension. Holter monitor showed sinus rhythm with occasional PACs and sinus tachycardia. No other significant arrhythmias were noted. I started her on diltiazem extended release 120 mg once daily and asked her to start an exercise program. She bought a treadmill and has been exercising 3 times a week and currently she is up to 2.9 miles per hour for 20 minutes. She reports significant improvement in her symptoms. She has no more palpitations or tachycardia.  No Known Allergies   Current Outpatient Prescriptions on File Prior to Visit  Medication Sig Dispense Refill  . diltiazem (CARDIZEM CD) 120 MG 24 hr capsule Take 1 capsule (120 mg total) by mouth daily.  30 capsule  6  . ferrous sulfate 325 (65 FE) MG tablet TAKE ONE TABLET BY MOUTH ONE TIME DAILY  30 tablet  2  . sertraline (ZOLOFT) 50 MG tablet Take 75 mg by mouth daily.         Past Medical History  Diagnosis Date  . Palpitations   . VTE (venous thromboembolism)     post partum, post procedure, Protein C deficiency ruled out     Past Surgical History  Procedure Date  . D&c (other)   . Myringotomy and tubes (other)   . Extraction of wisdom teeth   . Laparoscopic tubal ligation     [place]  . Tubal ligation      Family History  Problem Relation Age of Onset  . COPD Mother   .  Cancer Mother   . Early death Mother 29    Died at [facility] 21-Jun-2011 . Hypertension Father   . Kidney disease Father     renal calculi  . Heart disease Father 53    4 vessel CABG   . Cancer Maternal Grandmother     breast cancer     History   Social History  . Marital Status: Married    Spouse Name: N/A    Number of Children: N/A  . Years of Education: N/A   Occupational History  . Not on file.   Social History Main Topics  . Smoking status: Never Smoker   . Smokeless tobacco: Never Used  . Alcohol Use: No  . Drug Use: No  . Sexually Active: Not on file   Other Topics Concern  . 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           PHYSICAL EXAM   BP 120/98  Pulse 73  Ht 5\' 5"  (1.651 m)  Wt 259 lb 12.8 oz (117.845 kg)  BMI 43.23 kg/m2 Constitutional: She is oriented to person, place, and time. She appears well-developed and well-nourished. No distress.  HENT: No nasal discharge.  Head: Normocephalic and atraumatic.  Eyes: Pupils are equal and round. Right eye exhibits no discharge.  Left eye exhibits no discharge.  Neck: Normal range of motion. Neck supple. No JVD present. No thyromegaly present.  Cardiovascular: Normal rate, regular rhythm, normal heart sounds. Exam reveals no gallop and no friction rub. No murmur heard.  Pulmonary/Chest: Effort normal and breath sounds normal. No stridor. No respiratory distress. She has no wheezes. She has no rales. She exhibits no tenderness.  Abdominal: Soft. Bowel sounds are normal. She exhibits no distension. There is no tenderness. There is no rebound and no guarding.  Musculoskeletal: Normal range of motion. She exhibits no edema and no tenderness.  Neurological: She is alert and oriented to person, place, and time. Coordination normal.  Skin: Skin is warm and dry. No rash noted. She is not diaphoretic. No  erythema. No pallor.  Psychiatric: She has a normal mood and affect. Her behavior is normal. Judgment and thought content normal.    EKG: Normal sinus rhythm with sinus arrhythmia.   ASSESSMENT AND PLAN

## 2012-06-22 NOTE — Assessment & Plan Note (Signed)
Due to PACs and sinus tachycardia. Her symptoms resolved after the addition of small dose diltiazem and starting an aerobic exercise program. Continue current management and follow up as needed.

## 2012-06-22 NOTE — Assessment & Plan Note (Signed)
Dyspnea is improved from before. She is to continue exercise program.

## 2012-06-30 ENCOUNTER — Ambulatory Visit: Payer: Self-pay | Admitting: Gastroenterology

## 2012-07-02 LAB — PATHOLOGY REPORT

## 2012-07-16 LAB — HM COLONOSCOPY

## 2012-09-12 ENCOUNTER — Other Ambulatory Visit: Payer: Self-pay

## 2012-12-26 ENCOUNTER — Other Ambulatory Visit: Payer: Self-pay | Admitting: Internal Medicine

## 2013-01-25 ENCOUNTER — Ambulatory Visit (INDEPENDENT_AMBULATORY_CARE_PROVIDER_SITE_OTHER): Payer: BC Managed Care – PPO | Admitting: Internal Medicine

## 2013-01-25 ENCOUNTER — Encounter: Payer: Self-pay | Admitting: Internal Medicine

## 2013-01-25 VITALS — BP 130/90 | HR 86 | Ht 64.0 in | Wt 256.0 lb

## 2013-01-25 DIAGNOSIS — R0789 Other chest pain: Secondary | ICD-10-CM

## 2013-01-25 DIAGNOSIS — R000 Tachycardia, unspecified: Secondary | ICD-10-CM

## 2013-01-25 DIAGNOSIS — R0602 Shortness of breath: Secondary | ICD-10-CM

## 2013-01-25 DIAGNOSIS — I498 Other specified cardiac arrhythmias: Secondary | ICD-10-CM

## 2013-01-25 NOTE — Assessment & Plan Note (Addendum)
The patient has sinus tachycardia and symptoms of orthostatic intolerance; her daughter has similar symptoms. Her other daughter has been diagnosed with long QT. I don't think the patient's symptoms are related to long QT but I suggested that after she testing is done in the programming, [facility] testing should be performed.  We reviewed extensively the physiology of autonomic dysfunction. We'll plan to stop her calcium blocker and have given her prescriptions for atenolol, metoprolol and Corgard to see if we can identify beta blocker that she can't tolerate well and may help mitigate some of her symptoms. As relates to long QT, metoprolol would be inappropriate and at that juncture propranolol would make more sense

## 2013-01-25 NOTE — Progress Notes (Signed)
ELECTROPHYSIOLOGY CONSULT NOTE  Patient ID: Maria Moore, MRN: [id number], DOB/AGE: Nov 19, 1970 42 y.o. Admit date: (Not on file) Date of Consult: 01/25/2013  Primary Physician: Duncan Dull, MD Primary Cardiologist: MA Chief Complaint: ?LQTS   HPI Maria Moore is a 42 y.o. female  Seen as daughter has recently been diagnosed with LQTS unmasked by treadmill testing after recurrent syncope.  Gene testing is pending  Pt has long hx of Palps with modest exertion and orthostatic intolerance as well as Lightheadedness with showers, heat, and during pregnancy   Seen by PN 2009 for palps.  Echo>>normal  Holter monitor showed sinus rhythm and PACs. HgB and TSH 2013 normal   She was started on dilt years ago for the above   She has modest hypertension     Prior cardiac evaluation including echocardiogram 2013 --normal left ventricular function; she is atrial septal aneurysm.  Past Medical History  Diagnosis Date  . Palpitations   . VTE (venous thromboembolism)     post partum, post procedure, Protein C deficiency ruled out      Surgical History:  Past Surgical History  Procedure Laterality Date  . D&c (other)    . Myringotomy and tubes (other)    . Extraction of wisdom teeth    . Laparoscopic tubal ligation      [place]vath  . Tubal ligation       Home Meds: Prior to Admission medications   Medication Sig Start Date End Date Taking? Authorizing Provider  diltiazem (CARDIZEM CD) 120 MG 24 hr capsule Take 1 capsule (120 mg total) by mouth daily. 04/21/12  Yes Iran Ouch, MD  sertraline (ZOLOFT) 50 MG tablet Take 75 mg by mouth daily.   Yes Historical Provider, MD     Allergies: No Known Allergies  History   Social History  . Marital Status: Married    Spouse Name: N/A    Number of Children: N/A  . Years of Education: N/A   Occupational History  . Not on file.   Social History Main Topics  . Smoking status: Never Smoker   . Smokeless tobacco: Never Used   . Alcohol Use: No  . Drug Use: No  . Sexually Active: Not on file   Other Topics Concern  . 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      Family History  Problem Relation Age of Onset  . COPD Mother   . Cancer Mother   . Early death Mother 67    Died at [facility] 2011-05-16 . Hypertension Father   . Kidney disease Father     renal calculi  . Heart disease Father 83    4 vessel CABG   . Cancer Maternal Grandmother     breast cancer     ROS:  Please see the history of present illness.     All other systems reviewed and negative.    Physical Exam:   Blood pressure 130/90, pulse 86, height 5\' 4"  (1.626 m), weight 256 lb (116.121 kg). General: Well developed, well nourished morbidly obese  female in no acute distress. Head: Normocephalic, atraumatic, sclera non-icteric, no xanthomas, nares are without discharge. EENT: normal Lymph Nodes:  none Back: without scoliosis/kyphosis , no CVA tendersness Neck: Negative for carotid bruits. JVD not elevated. Lungs: Clear bilaterally to auscultation without wheezes, rales, or rhonchi. Breathing is unlabored. Heart: RRR with S1  S2. No murmur , rubs, or gallops appreciated. Abdomen: Soft, non-tender, non-distended with normoactive bowel sounds. No hepatomegaly. No rebound/guarding. No obvious abdominal masses. Msk:  Strength and tone appear normal for age. Extremities: No clubbing or cyanosis. No edema.  Distal pedal pulses are 2+ and equal bilaterally. Skin: Warm and Dry Neuro: Alert and oriented X 3. CN III-XII intact Grossly normal sensory and motor function . Psych:  Responds to questions appropriately with a normal affect.      Labs: Cardiac Enzymes No results found for this basename: CKTOTAL, CKMB, TROPONINI,  in the last 72 hours CBC Lab Results  Component Value Date   WBC 6.0 05/12/2012   HGB 14.1 05/12/2012   HCT 42.1  05/12/2012   MCV 89 05/12/2012   PLT 189 05/12/2012   PROTIME: No results found for this basename: LABPROT, INR,  in the last 72 hours Chemistry No results found for this basename: NA, K, CL, CO2, BUN, CREATININE, CALCIUM, LABALBU, PROT, BILITOT, ALKPHOS, ALT, AST, GLUCOSE,  in the last 168 hours Lipids Lab Results  Component Value Date   CHOL 219* 06/11/2011   HDL 51.40 06/11/2011   TRIG 91.0 06/11/2011   BNP Pro B Natriuretic peptide (BNP)  Date/Time Value Range Status  05/16/2008  4:09 PM <30.0   Final   Miscellaneous No results found for this basename: DDIMER    Radiology/Studies:  No results found.  EKG: NSR 14/.09/40 Normal Qt    Assessment and Plan:  Maria Moore

## 2013-01-25 NOTE — Patient Instructions (Addendum)
Patient was given written Rx for Metoprolol Succinate 50 mg, Atenolol 50 mg, Bystolic 20 mg, Metoprolol Tartrate 50 mg to try to see which one will work best. Patient is to call office after trying medications and will let us know which works best.  Follow up with Dr. Graciela Husbands in August.

## 2013-02-23 ENCOUNTER — Telehealth: Payer: Self-pay

## 2013-02-23 ENCOUNTER — Telehealth: Payer: Self-pay | Admitting: Internal Medicine

## 2013-02-23 MED ORDER — METOPROLOL TARTRATE 50 MG PO TABS: 50.0000 mg | ORAL_TABLET | Freq: Two times a day (BID) | ORAL | Status: DC

## 2013-02-23 NOTE — Telephone Encounter (Signed)
Pt would like rx for [employer] [facility] [employer]

## 2013-02-23 NOTE — Telephone Encounter (Signed)
I left a message for the patient to call. 

## 2013-02-23 NOTE — Telephone Encounter (Signed)
New Prob      Pt has a question for nurse regarding genetic testing. Please call.

## 2013-02-23 NOTE — Telephone Encounter (Signed)
I spoke with the patient. We discussed genetic testing through [facility] and cost issues with that. The patient is due to see Dr. Graciela Husbands back in August. No appointment was set. I have scheduled her for 03/16/13 at 12:15 pm to see Dr. Graciela Husbands. She will bring her daughter Elmarie Shiley (02/19/92) with her when she comes. Gene DX can be done on both people at the same time if needed.

## 2013-02-23 NOTE — Telephone Encounter (Signed)
Spoke with pt and she mentioned that she preferred the metoprolol tartrate 50 mg Bid when given a written Rx to try. Sent in refill for Metoprolol Tartrate 50 mg BID to [facility].

## 2013-03-16 ENCOUNTER — Ambulatory Visit: Payer: BC Managed Care – PPO | Admitting: Internal Medicine

## 2013-03-18 ENCOUNTER — Ambulatory Visit: Payer: BC Managed Care – PPO | Admitting: Internal Medicine

## 2013-05-08 ENCOUNTER — Ambulatory Visit (INDEPENDENT_AMBULATORY_CARE_PROVIDER_SITE_OTHER): Payer: BC Managed Care – PPO | Admitting: Emergency Medicine

## 2013-05-08 VITALS — BP 128/82 | HR 78 | Temp 98.9°F | Resp 17 | Ht 65.0 in | Wt 255.0 lb

## 2013-05-08 DIAGNOSIS — R300 Dysuria: Secondary | ICD-10-CM

## 2013-05-08 DIAGNOSIS — N390 Urinary tract infection, site not specified: Secondary | ICD-10-CM

## 2013-05-08 LAB — POCT URINALYSIS DIPSTICK
Bilirubin, UA: NEGATIVE
Glucose, UA: NEGATIVE
Ketones, UA: NEGATIVE
Nitrite, UA: NEGATIVE
Protein, UA: 30
Spec Grav, UA: >=1.03
Urobilinogen, UA: 0.2
pH, UA: 6

## 2013-05-08 LAB — POCT UA - MICROSCOPIC ONLY
Casts, Ur, LPF, POC: NEGATIVE
Crystals, Ur, HPF, POC: NEGATIVE
Yeast, UA: NEGATIVE

## 2013-05-08 MED ORDER — CIPROFLOXACIN HCL 250 MG PO TABS: 250.0000 mg | ORAL_TABLET | Freq: Two times a day (BID) | ORAL | Status: DC

## 2013-05-08 NOTE — Patient Instructions (Signed)
Urinary Tract Infection  Urinary tract infections (UTIs) can develop anywhere along your urinary tract. Your urinary tract is your body's drainage system for removing wastes and extra water. Your urinary tract includes two kidneys, two ureters, a bladder, and a urethra. Your kidneys are a pair of bean-shaped organs. Each kidney is about the size of your fist. They are located below your ribs, one on each side of your spine.  CAUSES  Infections are caused by microbes, which are microscopic organisms, including fungi, viruses, and bacteria. These organisms are so small that they can only be seen through a microscope. Bacteria are the microbes that most commonly cause UTIs.  SYMPTOMS   Symptoms of UTIs may vary by age and gender of the patient and by the location of the infection. Symptoms in young women typically include a frequent and intense urge to urinate and a painful, burning feeling in the bladder or urethra during urination. Older women and men are more likely to be tired, shaky, and weak and have muscle aches and abdominal pain. A fever may mean the infection is in your kidneys. Other symptoms of a kidney infection include pain in your back or sides below the ribs, nausea, and vomiting.  DIAGNOSIS  To diagnose a UTI, your caregiver will ask you about your symptoms. Your caregiver also will ask to provide a urine sample. The urine sample will be tested for bacteria and white blood cells. White blood cells are made by your body to help fight infection.  TREATMENT   Typically, UTIs can be treated with medication. Because most UTIs are caused by a bacterial infection, they usually can be treated with the use of antibiotics. The choice of antibiotic and length of treatment depend on your symptoms and the type of bacteria causing your infection.  HOME CARE INSTRUCTIONS   If you were prescribed antibiotics, take them exactly as your caregiver instructs you. Finish the medication even if you feel better after you  have only taken some of the medication.   Drink enough water and fluids to keep your urine clear or pale yellow.   Avoid caffeine, tea, and carbonated beverages. They tend to irritate your bladder.   Empty your bladder often. Avoid holding urine for long periods of time.   Empty your bladder before and after sexual intercourse.   After a bowel movement, women should cleanse from front to back. Use each tissue only once.  SEEK MEDICAL CARE IF:    You have back pain.   You develop a fever.   Your symptoms do not begin to resolve within 3 days.  SEEK IMMEDIATE MEDICAL CARE IF:    You have severe back pain or lower abdominal pain.   You develop chills.   You have nausea or vomiting.   You have continued burning or discomfort with urination.  MAKE SURE YOU:    Understand these instructions.   Will watch your condition.   Will get help right away if you are not doing well or get worse.  Document Released: 04/24/2005 Document Revised: 01/14/2012 Document Reviewed: 08/23/2011  [employer] Patient Information 2014 [employer]

## 2013-05-08 NOTE — Progress Notes (Signed)
Subjective:   This chart was scribed for Earl Lites, MD by Arlan Organ, ED Scribe. This patient was seen in room Room 12 and the patient's care was started 9:14 AM.   Patient ID: Maria Moore, female    DOB: [birth year], 42 y.o.   MRN: [id number]  HPI HPI Comments: Maria Moore is a 42 y.o. female who presents to [facility] complaining of a possible UTI that started yesterday around mid afternoon. Pt states she is experiencing dysuria, urinary urgency, and urinary frequency. Pt denies adequate water intake. Pt denies association of previous of UTIs with sexual activity. She states her last UTI was about 2 years ago. She reports her last LMP being a few weeks ago, and states it was normal. Pt states she is not pregnant. Pt denies any known allergies.   Review of Systems  Past Medical History  Diagnosis Date   Palpitations    VTE (venous thromboembolism)     post partum, post procedure, Protein C deficiency ruled out   Depression    Anemia     History   Social History   Marital Status: Married    Spouse Name: N/A    Number of Children: N/A   Years of Education: N/A   Occupational History   Not on file.   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    Past Surgical History  Procedure Laterality Date   D&c (other)     Myringotomy and tubes (other)     Extraction of wisdom teeth     Laparoscopic tubal ligation      Horvath   Tubal ligation      Family History  Problem Relation Age of Onset   COPD Mother    Cancer Mother    Early death Mother 66    Died at [facility] June 03, 2011  Hypertension Father    Kidney disease Father     renal  calculi   Heart disease Father 5    4 vessel CABG    Cancer Maternal Grandmother     breast cancer    Results for orders placed in visit on 05/08/13  POCT URINALYSIS DIPSTICK      Result Value Range   Color, UA yellow     Clarity, UA cloudy     Glucose, UA neg     Bilirubin, UA neg     Ketones, UA neg     Spec Grav, UA >=1.030     Blood, UA mod     pH, UA 6.0     Protein, UA 30     Urobilinogen, UA 0.2     Nitrite, UA neg     Leukocytes, UA Trace    POCT UA - MICROSCOPIC ONLY      Result Value Range   WBC, Ur, HPF, POC 4-12     RBC, urine, microscopic 6-19     Bacteria, U Microscopic 3+     Mucus, UA mod     Epithelial cells, urine per micros 4-8     Crystals, Ur, HPF, POC neg     Casts, Ur, LPF,  POC neg     Yeast, UA neg      There were no encounter diagnoses.           Objective:   Physical Exam  Constitutional: She is oriented to person, place, and time. She appears well-developed and well-nourished. No distress.  HENT:  Head: Normocephalic.  Eyes: Conjunctivae are normal. Pupils are equal, round, and reactive to light. No scleral icterus.  Neck: Normal range of motion. Neck supple. No thyromegaly present.  Cardiovascular: Normal rate and regular rhythm.  Exam reveals no gallop and no friction rub.   No murmur heard. Pulmonary/Chest: Effort normal and breath sounds normal. No respiratory distress. She has no wheezes. She has no rales.  Abdominal: Soft. Bowel sounds are normal. She exhibits no distension. There is no tenderness. There is no rebound.  Musculoskeletal: Normal range of motion.  Neurological: She is alert and oriented to person, place, and time.  Skin: Skin is warm and dry. No rash noted.  Psychiatric: She has a normal mood and affect. Her behavior is normal.   Results for orders placed in visit on 05/08/13  POCT URINALYSIS DIPSTICK      Result Value Range   Color, UA yellow     Clarity, UA cloudy     Glucose, UA neg     Bilirubin, UA neg      Ketones, UA neg     Spec Grav, UA >=1.030     Blood, UA mod     pH, UA 6.0     Protein, UA 30     Urobilinogen, UA 0.2     Nitrite, UA neg     Leukocytes, UA Trace    POCT UA - MICROSCOPIC ONLY      Result Value Range   WBC, Ur, HPF, POC 4-12     RBC, urine, microscopic 6-19     Bacteria, U Microscopic 3+     Mucus, UA mod     Epithelial cells, urine per micros 4-8     Crystals, Ur, HPF, POC neg     Casts, Ur, LPF, POC neg     Yeast, UA neg           Assessment & Plan:  We'll go ahead and culture the urine. Patient to be on Cipro 250 twice a day for 5 days

## 2013-05-08 NOTE — Progress Notes (Signed)
  Subjective:    Patient ID: Maria Moore, female    DOB: January 18, 1971, 42 y.o.   MRN: [id number]  HPI    Review of Systems  Genitourinary: Positive for dysuria and urgency. Negative for flank pain.       Objective:   Physical Exam there is no CVA pain. The abdomen is soft there is minimal suprapubic tenderness without rebound  Results for orders placed in visit on 05/08/13  POCT URINALYSIS DIPSTICK      Result Value Range   Color, UA yellow     Clarity, UA cloudy     Glucose, UA neg     Bilirubin, UA neg     Ketones, UA neg     Spec Grav, UA >=1.030     Blood, UA mod     pH, UA 6.0     Protein, UA 30     Urobilinogen, UA 0.2     Nitrite, UA neg     Leukocytes, UA Trace    POCT UA - MICROSCOPIC ONLY      Result Value Range   WBC, Ur, HPF, POC 4-12     RBC, urine, microscopic 6-19     Bacteria, U Microscopic 3+     Mucus, UA mod     Epithelial cells, urine per micros 4-8     Crystals, Ur, HPF, POC neg     Casts, Ur, LPF, POC neg     Yeast, UA neg          Assessment & Plan:    Will rx. For UTI. Cipro 250 BID for 5 days.

## 2013-05-09 LAB — URINE CULTURE: Colony Count: 35000

## 2013-05-12 ENCOUNTER — Other Ambulatory Visit: Payer: Self-pay | Admitting: Family Medicine

## 2013-05-12 ENCOUNTER — Ambulatory Visit (INDEPENDENT_AMBULATORY_CARE_PROVIDER_SITE_OTHER): Payer: BC Managed Care – PPO | Admitting: Family Medicine

## 2013-05-12 VITALS — BP 132/80 | HR 105 | Temp 98.7°F | Resp 18 | Ht 64.0 in | Wt 258.2 lb

## 2013-05-12 DIAGNOSIS — R81 Glycosuria: Secondary | ICD-10-CM

## 2013-05-12 DIAGNOSIS — N949 Unspecified condition associated with female genital organs and menstrual cycle: Secondary | ICD-10-CM

## 2013-05-12 DIAGNOSIS — R300 Dysuria: Secondary | ICD-10-CM

## 2013-05-12 DIAGNOSIS — Z01419 Encounter for gynecological examination (general) (routine) without abnormal findings: Secondary | ICD-10-CM

## 2013-05-12 DIAGNOSIS — R102 Pelvic and perineal pain: Secondary | ICD-10-CM

## 2013-05-12 DIAGNOSIS — R350 Frequency of micturition: Secondary | ICD-10-CM

## 2013-05-12 LAB — POCT CBC
Granulocyte percent: 58.2 %G (ref 37–80)
HCT, POC: 37.4 % — AB (ref 37.7–47.9)
Hemoglobin: 11.6 g/dL — AB (ref 12.2–16.2)
Lymph, poc: 2.4 (ref 0.6–3.4)
MCH, POC: 28.6 pg (ref 27–31.2)
MCHC: 31 g/dL — AB (ref 31.8–35.4)
MCV: 92 fL (ref 80–97)
MID (cbc): 0.4 (ref 0–0.9)
MPV: 8.4 fL (ref 0–99.8)
POC Granulocyte: 4 (ref 2–6.9)
POC LYMPH PERCENT: 35.3 %L (ref 10–50)
POC MID %: 6.5 %M (ref 0–12)
Platelet Count, POC: 163 10*3/uL (ref 142–424)
RBC: 4.06 M/uL (ref 4.04–5.48)
RDW, POC: 14.5 %
WBC: 6.8 10*3/uL (ref 4.6–10.2)

## 2013-05-12 LAB — POCT UA - MICROSCOPIC ONLY
Casts, Ur, LPF, POC: NEGATIVE
Crystals, Ur, HPF, POC: NEGATIVE
Yeast, UA: NEGATIVE

## 2013-05-12 LAB — POCT WET PREP WITH KOH
KOH Prep POC: NEGATIVE
Trichomonas, UA: NEGATIVE
Yeast Wet Prep HPF POC: NEGATIVE

## 2013-05-12 LAB — POCT URINALYSIS DIPSTICK
Bilirubin, UA: NEGATIVE
Glucose, UA: 250
Ketones, UA: NEGATIVE
Leukocytes, UA: NEGATIVE
Nitrite, UA: NEGATIVE
Protein, UA: NEGATIVE
Spec Grav, UA: 1.025
Urobilinogen, UA: 0.2
pH, UA: 5.5

## 2013-05-12 LAB — POCT GLYCOSYLATED HEMOGLOBIN (HGB A1C): Hemoglobin A1C: 5

## 2013-05-12 LAB — GLUCOSE, POCT (MANUAL RESULT ENTRY): POC Glucose: 86 mg/dl (ref 70–99)

## 2013-05-12 MED ORDER — CEPHALEXIN 500 MG PO CAPS: 500.0000 mg | ORAL_CAPSULE | Freq: Four times a day (QID) | ORAL | Status: DC

## 2013-05-12 MED ORDER — FLUCONAZOLE 150 MG PO TABS: 150.0000 mg | ORAL_TABLET | Freq: Once | ORAL | Status: DC

## 2013-05-12 NOTE — Patient Instructions (Signed)
If you don't have any improvement in symptoms in 2-3d, please call so we can refer you to a urology

## 2013-05-13 LAB — COMPREHENSIVE METABOLIC PANEL
ALT: 36 U/L — ABNORMAL HIGH (ref 0–35)
AST: 28 U/L (ref 0–37)
Albumin: 4.2 g/dL (ref 3.5–5.2)
Alkaline Phosphatase: 64 U/L (ref 39–117)
BUN: 12 mg/dL (ref 6–23)
CO2: 29 mEq/L (ref 19–32)
Calcium: 9.1 mg/dL (ref 8.4–10.5)
Chloride: 103 mEq/L (ref 96–112)
Creat: 0.71 mg/dL (ref 0.50–1.10)
Glucose, Bld: 88 mg/dL (ref 70–99)
Potassium: 3.9 mEq/L (ref 3.5–5.3)
Sodium: 139 mEq/L (ref 135–145)
Total Bilirubin: 0.4 mg/dL (ref 0.3–1.2)
Total Protein: 6.8 g/dL (ref 6.0–8.3)

## 2013-05-14 LAB — HM PAP SMEAR: HM Pap smear: NORMAL

## 2013-05-14 LAB — PAP IG AND HPV HIGH-RISK: HPV DNA High Risk: NOT DETECTED

## 2013-05-14 NOTE — Progress Notes (Signed)
This chart was scribed for Norberto Sorenson, MD by Danella Maiers, ED Scribe. This patient was seen in room 11 and the patient's care was started at 5:00 PM.  Subjective:    Patient ID: Maria Moore, female    DOB: 22-May-1971, 42 y.o.   MRN: [id number]  HPI HPI Comments: Maria Moore is a 42 y.o. female who presents to [facility] complaining of UTI. She saw a Dr. Cleta Alberts here 4 days and was put on cipro for a presumed UTI that ended up being culture negative but states she is still having symptoms. She states they have not gone away but also have not worsened. She reports burning, frequent urination with small output, mild "pressure" pain in the pelvis. She tried AZO for the first few days and experienced mild relief. When she stopped taking AZO her symptoms worsened. She denies urinary or bowel incontinence, vaginal discharge, abdominal pain, nausea, vomiting, fever, chills. She is sexually active. She had her tubes tied 4 1/2 years ago. Her last pelvic exam was 4 1/2 years ago. She has five children.   Past Medical History  Diagnosis Date  . Palpitations   . VTE (venous thromboembolism)     post partum, post procedure, Protein C deficiency ruled out  . Depression   . Anemia     Current Outpatient Prescriptions on File Prior to Visit  Medication Sig Dispense Refill  . metoprolol (LOPRESSOR) 50 MG tablet Take 1 tablet (50 mg total) by mouth 2 (two) times daily.  60 tablet  3  . diltiazem (CARDIZEM CD) 120 MG 24 hr capsule Take 1 capsule (120 mg total) by mouth daily.  30 capsule  6  . sertraline (ZOLOFT) 50 MG tablet Take 75 mg by mouth daily.       No current facility-administered medications on file prior to visit.   No Known Allergies  Review of Systems  Constitutional: Negative for fever, chills, diaphoresis, activity change, appetite change, fatigue and unexpected weight change.  Gastrointestinal: Negative for nausea, vomiting, abdominal pain, diarrhea, constipation, blood in stool, anal  bleeding and rectal pain.  Genitourinary: Positive for dysuria, frequency and pelvic pain. Negative for urgency, hematuria, flank pain, decreased urine volume, vaginal bleeding, vaginal discharge, enuresis, difficulty urinating, genital sores, vaginal pain, menstrual problem and dyspareunia.  Musculoskeletal: Negative for gait problem.  Skin: Negative for rash.  Hematological: Negative for adenopathy.  Psychiatric/Behavioral: The patient is not nervous/anxious.       BP 132/80  Pulse 105  Temp(Src) 98.7 F (37.1 C) (Oral)  Resp 18  Ht 5\' 4"  (1.626 m)  Wt 258 lb 3.2 oz (117.119 kg)  BMI 44.3 kg/m2  SpO2 98%  LMP 04/24/2013 Objective:   Physical Exam  Nursing note and vitals reviewed. Constitutional: She is oriented to person, place, and time. She appears well-developed and well-nourished. No distress.  HENT:  Head: Normocephalic and atraumatic.  Eyes: EOM are normal.  Neck: Neck supple. No tracheal deviation present.  Cardiovascular: Normal rate, regular rhythm and normal heart sounds.  Exam reveals no gallop and no friction rub.   No murmur heard. Pulmonary/Chest: Effort normal and breath sounds normal. No respiratory distress. She has no wheezes. She has no rales.  Abdominal: Soft. Normal appearance and bowel sounds are normal. She exhibits no mass. There is no hepatosplenomegaly. There is tenderness in the epigastric area. There is no rigidity, no guarding and no CVA tenderness.  No CVA tenderness. Mild epigastric discomfort.  Genitourinary: No labial fusion. There is no  rash, tenderness, lesion or injury on the right labia. There is no rash, tenderness, lesion or injury on the left labia. Uterus is tender. Uterus is not deviated, not enlarged and not fixed. Cervix exhibits no motion tenderness, no discharge and no friability. Right adnexum displays no mass, no tenderness and no fullness. Left adnexum displays no mass, no tenderness and no fullness. No erythema, tenderness or  bleeding around the vagina. No foreign body around the vagina. No signs of injury around the vagina. Vaginal discharge found.  Normal vagina, normal labia. Moderate  Amt of thick white vag discharge. Normal cervix. tenderness over her uterus. Normal adnexa.  Musculoskeletal: Normal range of motion.  Neurological: She is alert and oriented to person, place, and time.  Skin: Skin is warm and dry.  Psychiatric: She has a normal mood and affect. Her behavior is normal.      Results for orders placed in visit on 05/12/13  COMPREHENSIVE METABOLIC PANEL      Result Value Range   Sodium 139  135 - 145 mEq/L   Potassium 3.9  3.5 - 5.3 mEq/L   Chloride 103  96 - 112 mEq/L   CO2 29  19 - 32 mEq/L   Glucose, Bld 88  70 - 99 mg/dL   BUN 12  6 - 23 mg/dL   Creat 0.98  1.19 - 1.47 mg/dL   Total Bilirubin 0.4  0.3 - 1.2 mg/dL   Alkaline Phosphatase 64  39 - 117 U/L   AST 28  0 - 37 U/L   ALT 36 (*) 0 - 35 U/L   Total Protein 6.8  6.0 - 8.3 g/dL   Albumin 4.2  3.5 - 5.2 g/dL   Calcium 9.1  8.4 - 82.9 mg/dL  POCT UA - MICROSCOPIC ONLY      Result Value Range   WBC, Ur, HPF, POC 1-3     RBC, urine, microscopic 2-6     Bacteria, U Microscopic 2+     Mucus, UA moderate     Epithelial cells, urine per micros 4-7     Crystals, Ur, HPF, POC neg     Casts, Ur, LPF, POC neg     Yeast, UA neg    POCT URINALYSIS DIPSTICK      Result Value Range   Color, UA yellow     Clarity, UA clear     Glucose, UA 250     Bilirubin, UA neg     Ketones, UA neg     Spec Grav, UA 1.025     Blood, UA small     pH, UA 5.5     Protein, UA neg     Urobilinogen, UA 0.2     Nitrite, UA neg     Leukocytes, UA Negative    POCT WET PREP WITH KOH      Result Value Range   Trichomonas, UA Negative     Clue Cells Wet Prep HPF POC 0-2     Epithelial Wet Prep HPF POC 3-8     Yeast Wet Prep HPF POC neg     Bacteria Wet Prep HPF POC 3+     RBC Wet Prep HPF POC 0-1     WBC Wet Prep HPF POC 7-15     KOH Prep POC Negative     GLUCOSE, POCT (MANUAL RESULT ENTRY)      Result Value Range   POC Glucose 86  70 - 99 mg/dl  POCT GLYCOSYLATED HEMOGLOBIN (HGB  A1C)      Result Value Range   Hemoglobin A1C 5.0    POCT CBC      Result Value Range   WBC 6.8  4.6 - 10.2 K/uL   Lymph, poc 2.4  0.6 - 3.4   POC LYMPH PERCENT 35.3  10 - 50 %L   MID (cbc) 0.4  0 - 0.9   POC MID % 6.5  0 - 12 %M   POC Granulocyte 4.0  2 - 6.9   Granulocyte percent 58.2  37 - 80 %G   RBC 4.06  4.04 - 5.48 M/uL   Hemoglobin 11.6 (*) 12.2 - 16.2 g/dL   HCT, POC 45.4 (*) 09.8 - 47.9 %   MCV 92.0  80 - 97 fL   MCH, POC 28.6  27 - 31.2 pg   MCHC 31.0 (*) 31.8 - 35.4 g/dL   RDW, POC 11.9     Platelet Count, POC 163  142 - 424 K/uL   MPV 8.4  0 - 99.8 fL   Assessment & Plan:  Increased frequency of urination - Plan: POCT UA - Microscopic Only, POCT urinalysis dipstick  Burning with urination - Plan: POCT UA - Microscopic Only, POCT urinalysis dipstick - prior UClx did not grow anything AND pt was treated w/ full course of cipro so concerning that pt is still having sxs. New UClx pending and will try trx w/ kelfex but did do pelvic exam today to ensure no other etiology for her pain could be found.  Well female exam with routine gynecological exam - Plan: Pap IG and HPV (high risk) DNA detection,   Pelvic pain in female - Plan: POCT Wet Prep with KOH, Urine culture  Glucosuria - Plan: POCT glucose (manual entry), POCT glycosylated hemoglobin (Hb A1C), POCT CBC, Comprehensive metabolic panel - nml, No pre-DM.  Meds ordered this encounter  Medications  . cephALEXin (KEFLEX) 500 MG capsule    Sig: Take 1 capsule (500 mg total) by mouth 4 (four) times daily.    Dispense:  28 capsule    Refill:  0  . fluconazole (DIFLUCAN) 150 MG tablet    Sig: Take 1 tablet (150 mg total) by mouth once. Repeat if needed    Dispense:  2 tablet    Refill:  0    I personally performed the services described in this documentation, which was scribed in my  presence. The recorded information has been reviewed and considered, and addended by me as needed.  Norberto Sorenson, MD MPH

## 2013-05-16 LAB — URINE CULTURE: Colony Count: 75000

## 2013-05-17 MED ORDER — SULFAMETHOXAZOLE-TRIMETHOPRIM 800-160 MG PO TABS: 1.0000 | ORAL_TABLET | Freq: Two times a day (BID) | ORAL | Status: DC

## 2013-06-03 ENCOUNTER — Other Ambulatory Visit: Payer: Self-pay

## 2013-06-28 ENCOUNTER — Other Ambulatory Visit: Payer: Self-pay | Admitting: Internal Medicine

## 2013-06-29 ENCOUNTER — Other Ambulatory Visit: Payer: Self-pay

## 2013-06-29 MED ORDER — METOPROLOL TARTRATE 50 MG PO TABS: 50.0000 mg | ORAL_TABLET | Freq: Two times a day (BID) | ORAL | Status: DC

## 2013-06-29 NOTE — Telephone Encounter (Signed)
Refill sent for metoprolol tart 50 mg take one tablet twice a day.

## 2013-07-12 ENCOUNTER — Ambulatory Visit: Payer: BC Managed Care – PPO | Admitting: Adult Health

## 2013-07-14 ENCOUNTER — Ambulatory Visit (INDEPENDENT_AMBULATORY_CARE_PROVIDER_SITE_OTHER): Payer: BC Managed Care – PPO | Admitting: Internal Medicine

## 2013-07-14 ENCOUNTER — Other Ambulatory Visit: Payer: Self-pay

## 2013-07-14 VITALS — BP 130/68 | HR 99 | Resp 12 | Wt 261.0 lb

## 2013-07-14 DIAGNOSIS — E66813 Obesity, class 3: Secondary | ICD-10-CM

## 2013-07-14 DIAGNOSIS — Z1239 Encounter for other screening for malignant neoplasm of breast: Secondary | ICD-10-CM

## 2013-07-14 DIAGNOSIS — M25579 Pain in unspecified ankle and joints of unspecified foot: Secondary | ICD-10-CM

## 2013-07-14 DIAGNOSIS — Z860100 Personal history of colon polyps, unspecified: Secondary | ICD-10-CM

## 2013-07-14 DIAGNOSIS — M79609 Pain in unspecified limb: Secondary | ICD-10-CM

## 2013-07-14 DIAGNOSIS — D649 Anemia, unspecified: Secondary | ICD-10-CM

## 2013-07-14 DIAGNOSIS — G8929 Other chronic pain: Secondary | ICD-10-CM

## 2013-07-14 DIAGNOSIS — Z9889 Other specified postprocedural states: Secondary | ICD-10-CM

## 2013-07-14 DIAGNOSIS — K8020 Calculus of gallbladder without cholecystitis without obstruction: Secondary | ICD-10-CM

## 2013-07-14 DIAGNOSIS — M25571 Pain in right ankle and joints of right foot: Secondary | ICD-10-CM

## 2013-07-14 DIAGNOSIS — Z1231 Encounter for screening mammogram for malignant neoplasm of breast: Secondary | ICD-10-CM

## 2013-07-14 LAB — CBC WITH DIFFERENTIAL/PLATELET
Basophils Absolute: 0 K/uL (ref 0.0–0.1)
Basophils Relative: 0.4 % (ref 0.0–3.0)
Eosinophils Absolute: 0.1 K/uL (ref 0.0–0.7)
Eosinophils Relative: 2.5 % (ref 0.0–5.0)
HCT: 38.4 % (ref 36.0–46.0)
Hemoglobin: 13.1 g/dL (ref 12.0–15.0)
Lymphocytes Relative: 15.5 % (ref 12.0–46.0)
Lymphs Abs: 0.9 K/uL (ref 0.7–4.0)
MCHC: 34.2 g/dL (ref 30.0–36.0)
MCV: 86.1 fl (ref 78.0–100.0)
Monocytes Absolute: 0.5 K/uL (ref 0.1–1.0)
Monocytes Relative: 8.3 % (ref 3.0–12.0)
Neutro Abs: 4.1 K/uL (ref 1.4–7.7)
Neutrophils Relative %: 73.3 % (ref 43.0–77.0)
Platelets: 163 K/uL (ref 150.0–400.0)
RBC: 4.46 Mil/uL (ref 3.87–5.11)
RDW: 13.6 % (ref 11.5–14.6)
WBC: 5.6 K/uL (ref 4.5–10.5)

## 2013-07-14 LAB — FERRITIN: Ferritin: 27.1 ng/mL (ref 10.0–291.0)

## 2013-07-14 LAB — IRON AND TIBC
%SAT: 21 % (ref 20–55)
Iron: 71 ug/dL (ref 42–145)
TIBC: 336 ug/dL (ref 250–470)
UIBC: 265 ug/dL (ref 125–400)

## 2013-07-14 LAB — TSH: TSH: 1.45 u[IU]/mL (ref 0.35–5.50)

## 2013-07-14 MED ORDER — TRAMADOL HCL 50 MG PO TABS: 50.0000 mg | ORAL_TABLET | Freq: Three times a day (TID) | ORAL | Status: DC | PRN

## 2013-07-14 MED ORDER — PREDNISONE (PAK) 10 MG PO TABS: ORAL_TABLET | ORAL | Status: DC

## 2013-07-14 NOTE — Assessment & Plan Note (Signed)
Bleeding has resume,  42 yo 6 days ,  Heavy for 3 days.

## 2013-07-14 NOTE — Progress Notes (Signed)
Patient ID: Maria Moore, female   DOB: 12-15-1970, 42 y.o.   MRN: [id number]   Patient Active Problem List   Diagnosis Date Noted  . Cholelithiasis without obstruction 07/16/2013  . Pain in joint, ankle and foot 07/16/2013  . Anemia 07/16/2013  . S/P endometrial ablation 07/14/2013  . Obesity, Class III, BMI 40-49.9 (morbid obesity) 05/23/2012  . Shortness of breath 03/27/2012  . 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 Fatigue 03/16/2012  . VTE (venous thromboembolism)   . History of DVT of lower extremity 06/11/2011  . S/P tubal ligation 06/11/2011  . PALPITATIONS 07/04/2008    Subjective:  CC:   Chief Complaint  Patient presents with  . righr foot pain    HPI:   Maria Moore a 42 y.o. female who presents after being lost to follow up for over a year with multiple issues to discuss:  Foot pain.  In September she developed pain in bottom of heel which was aggravated by weight bearing positions.  No prior trauma.  But thinks she may have bruised the bottom  Of it while playin g with kids on a cement floor .  Over time the pain has become has become diffuse now involves the midfoot and dorsum,  but has been walking differently because of the heel pain. She has tried 800 mg ibuprofen twice daily for a few weeks which kept if from getting worse.,  Tried ice,  Moist heat.  No significant lasting improvement.    She was treated recently at the [facility]  for UTI,  Which turned out to be drug resistant requiring change in antibiotics.  During workup had a  PAP smear and labs investigating  Glucosuria.  Labs were reviewed with patient today and were reassuring that there was evidence of diabetes.    She has been having heavy menses for the last several months despite having undergone an endometrial  ablation over 3 years ago for same. Was found to be anemic.    Past Medical History  Diagnosis Date  . Palpitations   . VTE (venous  thromboembolism)     post partum, post procedure, Protein C deficiency ruled out  . Depression   . Anemia     Past Surgical History  Procedure Laterality Date  . D&c (other)    . Myringotomy and tubes (other)    . Extraction of wisdom teeth    . Laparoscopic tubal ligation      [facility]vath  . Tubal ligation         The following portions of the patient's history were reviewed and updated as appropriate: Allergies, current medications, and problem list.    Review of Systems:   12 Pt  review of systems was negative except those addressed in the HPI,     History   Social History  . Marital Status: Married    Spouse Name: N/A    Number of Children: N/A  . Years of Education: N/A   Occupational History  . Not on file.   Social History Main Topics  . Smoking status: Never Smoker   . Smokeless tobacco: Never Used  . Alcohol Use: No  . Drug Use: No  . Sexual Activity: Yes    Birth Control/ Protection: None   Other Topics Concern  . 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     Objective:  Filed Vitals:   07/14/13 0800  BP: 130/68  Pulse: 99     General appearance: alert, cooperative and appears stated age Ears: normal TM's and external ear canals both ears Throat: lips, mucosa, and tongue normal; teeth and gums normal Neck: no adenopathy, no carotid bruit, supple, symmetrical, trachea midline and thyroid not enlarged, symmetric, no tenderness/mass/nodules Back: symmetric, no curvature. ROM normal. No CVA tenderness. Lungs: clear to auscultation bilaterally Heart: regular rate and rhythm, S1, S2 normal, no murmur, click, rub or gallop Abdomen: soft, non-tender; bowel sounds normal; no masses,  no organomegaly Pulses: 2+ and symmetric Skin: Skin color, texture, turgor normal. No rashes or lesions Lymph nodes: Cervical, supraclavicular, and axillary nodes  normal.  Assessment and Plan:  S/P endometrial ablation Bleeding has resume,  42 yo 6 days ,  Heavy for 3 days.    Pain in joint, ankle and foot History suggestive of bone spur or bone bruise of calcaneus with subsequent ankle ligaments strain from antalgic gait.  Referral to Podiatry foe evaluation since conservative treatment has failed.  Trial of predisone taper and tramadol.   Anemia Her anemia was noted in October by Urgent Care. Repeat hgb today along with iron studies and thyroid screen were all normal.   Obesity, Class III, BMI 40-49.9 (morbid obesity) I have addressed  BMI and recommended wt loss of 10% of body weight over the next 6 months using a low glycemic index diet and regular exercise a minimum of 5 days per week.    Personal history of colonic polyps Diagnostic endoscopic evaluation was done in 2013 and several polyps removed for biopsy.  3 yr follow up advised.   Screening for breast cancer She is overdue fore screening mammogram.   A total of 40 minutes was spent with patient more than half of which was spent in counseling, reviewing records from other providers and coordination of care.   Updated Medication List Outpatient Encounter Prescriptions as of 07/14/2013  Medication Sig  . famotidine (PEPCID) 10 MG tablet Take 10 mg by mouth 2 (two) times daily.  . metoprolol (LOPRESSOR) 50 MG tablet Take 1 tablet (50 mg total) by mouth 2 (two) times daily.  . predniSONE (STERAPRED UNI-PAK) 10 MG tablet 6 tablets on Day 1 , then reduce by 1 tablet daily until gone  . traMADol (ULTRAM) 50 MG tablet Take 1 tablet (50 mg total) by mouth every 8 (eight) hours as needed.  . [DISCONTINUED] diltiazem (CARDIZEM CD) 120 MG 24 hr capsule Take 1 capsule (120 mg total) by mouth daily.  . [DISCONTINUED] fluconazole (DIFLUCAN) 150 MG tablet Take 1 tablet (150 mg total) by mouth once. Repeat if needed  . [DISCONTINUED] sertraline (ZOLOFT) 50 MG tablet Take 75 mg by mouth daily.  .  [DISCONTINUED] sulfamethoxazole-trimethoprim (BACTRIM DS,SEPTRA DS) 800-160 MG per tablet Take 1 tablet by mouth 2 (two) times daily.

## 2013-07-14 NOTE — Patient Instructions (Signed)
I am treating you with a prednisoe taper and tramadol, a pain reliever.  Suspend all motrin and aleve for 10 days,  Ok to use tylenol  I suspect you have a bone spur on your heel.  Podiatry referral to Dr Ether Griffins in process   You are OVERDUE for mammogram !!

## 2013-07-14 NOTE — Progress Notes (Signed)
Pre visit review using our clinic review tool, if applicable. No additional management support is needed unless otherwise documented below in the visit note. 

## 2013-07-15 ENCOUNTER — Ambulatory Visit (INDEPENDENT_AMBULATORY_CARE_PROVIDER_SITE_OTHER): Payer: 59 | Admitting: Podiatry

## 2013-07-15 ENCOUNTER — Encounter: Payer: Self-pay | Admitting: Podiatry

## 2013-07-15 ENCOUNTER — Ambulatory Visit (INDEPENDENT_AMBULATORY_CARE_PROVIDER_SITE_OTHER): Payer: 59

## 2013-07-15 VITALS — BP 129/78 | HR 93 | Resp 16 | Ht 64.0 in | Wt 261.0 lb

## 2013-07-15 DIAGNOSIS — M722 Plantar fascial fibromatosis: Secondary | ICD-10-CM

## 2013-07-15 MED ORDER — MELOXICAM 15 MG PO TABS: 15.0000 mg | ORAL_TABLET | Freq: Every day | ORAL | Status: DC

## 2013-07-15 NOTE — Patient Instructions (Signed)
Plantar Fasciitis (Heel Spur Syndrome) with Rehab The plantar fascia is a fibrous, ligament-like, soft-tissue structure that spans the bottom of the foot. Plantar fasciitis is a condition that causes pain in the foot due to inflammation of the tissue. SYMPTOMS   Pain and tenderness on the underneath side of the foot.  Pain that worsens with standing or walking. CAUSES  Plantar fasciitis is caused by irritation and injury to the plantar fascia on the underneath side of the foot. Common mechanisms of injury include:  Direct trauma to bottom of the foot.  Damage to a small nerve that runs under the foot where the main fascia attaches to the heel bone.  Stress placed on the plantar fascia due to bone spurs. RISK INCREASES WITH:   Activities that place stress on the plantar fascia (running, jumping, pivoting, or cutting).  Poor strength and flexibility.  Improperly fitted shoes.  Tight calf muscles.  Flat feet.  Failure to warm-up properly before activity.  Obesity. PREVENTION  Warm up and stretch properly before activity.  Allow for adequate recovery between workouts.  Maintain physical fitness:  Strength, flexibility, and endurance.  Cardiovascular fitness.  Maintain a health body weight.  Avoid stress on the plantar fascia.  Wear properly fitted shoes, including arch supports for individuals who have flat feet. PROGNOSIS  If treated properly, then the symptoms of plantar fasciitis usually resolve without surgery. However, occasionally surgery is necessary. RELATED COMPLICATIONS   Recurrent symptoms that may result in a chronic condition.  Problems of the lower back that are caused by compensating for the injury, such as limping.  Pain or weakness of the foot during push-off following surgery.  Chronic inflammation, scarring, and partial or complete fascia tear, occurring more often from repeated injections. TREATMENT  Treatment initially involves the use of  ice and medication to help reduce pain and inflammation. The use of strengthening and stretching exercises may help reduce pain with activity, especially stretches of the Achilles tendon. These exercises may be performed at home or with a therapist. Your caregiver may recommend that you use heel cups of arch supports to help reduce stress on the plantar fascia. Occasionally, corticosteroid injections are given to reduce inflammation. If symptoms persist for greater than 6 months despite non-surgical (conservative), then surgery may be recommended.  MEDICATION   If pain medication is necessary, then nonsteroidal anti-inflammatory medications, such as aspirin and ibuprofen, or other minor pain relievers, such as acetaminophen, are often recommended.  Do not take pain medication within 7 days before surgery.  Prescription pain relievers may be given if deemed necessary by your caregiver. Use only as directed and only as much as you need.  Corticosteroid injections may be given by your caregiver. These injections should be reserved for the most serious cases, because they may only be given a certain number of times. HEAT AND COLD  Cold treatment (icing) relieves pain and reduces inflammation. Cold treatment should be applied for 10 to 15 minutes every 2 to 3 hours for inflammation and pain and immediately after any activity that aggravates your symptoms. Use ice packs or massage the area with a piece of ice (ice massage).  Heat treatment may be used prior to performing the stretching and strengthening activities prescribed by your caregiver, physical therapist, or athletic trainer. Use a heat pack or soak the injury in warm water. SEEK IMMEDIATE MEDICAL CARE IF:  Treatment seems to offer no benefit, or the condition worsens.  Any medications produce adverse side effects. EXERCISES RANGE   OF MOTION (ROM) AND STRETCHING EXERCISES - Plantar Fasciitis (Heel Spur Syndrome) These exercises may help you  when beginning to rehabilitate your injury. Your symptoms may resolve with or without further involvement from your physician, physical therapist or athletic trainer. While completing these exercises, remember:   Restoring tissue flexibility helps normal motion to return to the joints. This allows healthier, less painful movement and activity.  An effective stretch should be held for at least 30 seconds.  A stretch should never be painful. You should only feel a gentle lengthening or release in the stretched tissue. RANGE OF MOTION - Toe Extension, Flexion  Sit with your right / left leg crossed over your opposite knee.  Grasp your toes and gently pull them back toward the top of your foot. You should feel a stretch on the bottom of your toes and/or foot.  Hold this stretch for __________ seconds.  Now, gently pull your toes toward the bottom of your foot. You should feel a stretch on the top of your toes and or foot.  Hold this stretch for __________ seconds. Repeat __________ times. Complete this stretch __________ times per day.  RANGE OF MOTION - Ankle Dorsiflexion, Active Assisted  Remove shoes and sit on a chair that is preferably not on a carpeted surface.  Place right / left foot under knee. Extend your opposite leg for support.  Keeping your heel down, slide your right / left foot back toward the chair until you feel a stretch at your ankle or calf. If you do not feel a stretch, slide your bottom forward to the edge of the chair, while still keeping your heel down.  Hold this stretch for __________ seconds. Repeat __________ times. Complete this stretch __________ times per day.  STRETCH  Gastroc, Standing  Place hands on wall.  Extend right / left leg, keeping the front knee somewhat bent.  Slightly point your toes inward on your back foot.  Keeping your right / left heel on the floor and your knee straight, shift your weight toward the wall, not allowing your back to  arch.  You should feel a gentle stretch in the right / left calf. Hold this position for __________ seconds. Repeat __________ times. Complete this stretch __________ times per day. STRETCH  Soleus, Standing  Place hands on wall.  Extend right / left leg, keeping the other knee somewhat bent.  Slightly point your toes inward on your back foot.  Keep your right / left heel on the floor, bend your back knee, and slightly shift your weight over the back leg so that you feel a gentle stretch deep in your back calf.  Hold this position for __________ seconds. Repeat __________ times. Complete this stretch __________ times per day. STRETCH  Gastrocsoleus, Standing  Note: This exercise can place a lot of stress on your foot and ankle. Please complete this exercise only if specifically instructed by your caregiver.   Place the ball of your right / left foot on a step, keeping your other foot firmly on the same step.  Hold on to the wall or a rail for balance.  Slowly lift your other foot, allowing your body weight to press your heel down over the edge of the step.  You should feel a stretch in your right / left calf.  Hold this position for __________ seconds.  Repeat this exercise with a slight bend in your right / left knee. Repeat __________ times. Complete this stretch __________ times per day.    STRENGTHENING EXERCISES - Plantar Fasciitis (Heel Spur Syndrome)  These exercises may help you when beginning to rehabilitate your injury. They may resolve your symptoms with or without further involvement from your physician, physical therapist or athletic trainer. While completing these exercises, remember:   Muscles can gain both the endurance and the strength needed for everyday activities through controlled exercises.  Complete these exercises as instructed by your physician, physical therapist or athletic trainer. Progress the resistance and repetitions only as guided. STRENGTH - Towel  Curls  Sit in a chair positioned on a non-carpeted surface.  Place your foot on a towel, keeping your heel on the floor.  Pull the towel toward your heel by only curling your toes. Keep your heel on the floor.  If instructed by your physician, physical therapist or athletic trainer, add ____________________ at the end of the towel. Repeat __________ times. Complete this exercise __________ times per day. STRENGTH - Ankle Inversion  Secure one end of a rubber exercise band/tubing to a fixed object (table, pole). Loop the other end around your foot just before your toes.  Place your fists between your knees. This will focus your strengthening at your ankle.  Slowly, pull your big toe up and in, making sure the band/tubing is positioned to resist the entire motion.  Hold this position for __________ seconds.  Have your muscles resist the band/tubing as it slowly pulls your foot back to the starting position. Repeat __________ times. Complete this exercises __________ times per day.  Document Released: 07/15/2005 Document Revised: 10/07/2011 Document Reviewed: 10/27/2008 [employer] Patient Information 2014 [employer]. Plantar Fasciitis Plantar fasciitis is a common condition that causes foot pain. It is soreness (inflammation) of the band of tough fibrous tissue on the bottom of the foot that runs from the heel bone (calcaneus) to the ball of the foot. The cause of this soreness may be from excessive standing, poor fitting shoes, running on hard surfaces, being overweight, having an abnormal walk, or overuse (this is common in runners) of the painful foot or feet. It is also common in aerobic exercise dancers and ballet dancers. SYMPTOMS  Most people with plantar fasciitis complain of:  Severe pain in the morning on the bottom of their foot especially when taking the first steps out of bed. This pain recedes after a few minutes of walking.  Severe pain is experienced also during walking  following a long period of inactivity.  Pain is worse when walking barefoot or up stairs DIAGNOSIS   Your caregiver will diagnose this condition by examining and feeling your foot.  Special tests such as X-rays of your foot, are usually not needed. PREVENTION   Consult a sports medicine professional before beginning a new exercise program.  Walking programs offer a good workout. With walking there is a lower chance of overuse injuries common to runners. There is less impact and less jarring of the joints.  Begin all new exercise programs slowly. If problems or pain develop, decrease the amount of time or distance until you are at a comfortable level.  Wear good shoes and replace them regularly.  Stretch your foot and the heel cords at the back of the ankle (Achilles tendon) both before and after exercise.  Run or exercise on even surfaces that are not hard. For example, asphalt is better than pavement.  Do not run barefoot on hard surfaces.  If using a treadmill, vary the incline.  Do not continue to workout if you have foot or joint   problems. Seek professional help if they do not improve. HOME CARE INSTRUCTIONS   Avoid activities that cause you pain until you recover.  Use ice or cold packs on the problem or painful areas after working out.  Only take over-the-counter or prescription medicines for pain, discomfort, or fever as directed by your caregiver.  Soft shoe inserts or athletic shoes with air or gel sole cushions may be helpful.  If problems continue or become more severe, consult a sports medicine caregiver or your own health care provider. Cortisone is a potent anti-inflammatory medication that may be injected into the painful area. You can discuss this treatment with your caregiver. MAKE SURE YOU:   Understand these instructions.  Will watch your condition.  Will get help right away if you are not doing well or get worse. Document Released: 04/09/2001 Document  Revised: 10/07/2011 Document Reviewed: 06/08/2008 [employer] Patient Information 2014 [employer].  

## 2013-07-15 NOTE — Progress Notes (Signed)
   Subjective:    Patient ID: Maria Moore, female    DOB: 07/29/1971, 42 y.o.   MRN: [id number]  HPI Comments: "think i hurt my foot in September"  N sharp pain  L right plantar heel  D sept O all of sudden  C worse  A after sitting and getting up T dr Darrick Huntsman started prednisone, and ultram   Foot Pain      Review of Systems  All other systems reviewed and are negative.       Objective:   Physical Exam:. I have reviewed her past medical history medications allergies surgeries social history view systems. Vital signs are stable she is alert and oriented x3. Pulses are palpable bilateral +2/4 DPPT bilateral tablet fill time to digits one through 5 is noted to be immediate. Neurologic sensorium is intact per Semmes-Weinstein monofilament. Deep tendon reflexes are brisk and equal bilateral. Muscle strength is 5 over 5 dorsiflexors plantar flexors inverters everters all intrinsic musculature is intact. Orthopedic evaluation Mr. is all joints distal to the ankle a full range of motion without crepitus she has pain on palpation medial continued tubercle of the right heel. Radiographic evaluation does demonstrate a plantar distally oriented calcaneal heel spur with soft tissue increase in density at the plantar fascial calcaneal insertion site.        Assessment & Plan:  Assessment: Fasciitis right.  Plan: We discussed the etiology pathology conservative versus surgical therapies. We discussed appropriate shoe gear stretching exercises ice therapy and shoe modifications. She was given a prescription for Sterapred Dosepak to be followed by [facility]. She had an injection to the right heel. Plantar fascial strapping and a night splint was dispensed. I will followup with her in one month

## 2013-07-16 ENCOUNTER — Encounter: Payer: Self-pay | Admitting: Internal Medicine

## 2013-07-16 DIAGNOSIS — Z860100 Personal history of colon polyps, unspecified: Secondary | ICD-10-CM | POA: Insufficient documentation

## 2013-07-16 DIAGNOSIS — D649 Anemia, unspecified: Secondary | ICD-10-CM | POA: Insufficient documentation

## 2013-07-16 DIAGNOSIS — Z1239 Encounter for other screening for malignant neoplasm of breast: Secondary | ICD-10-CM | POA: Insufficient documentation

## 2013-07-16 DIAGNOSIS — M25579 Pain in unspecified ankle and joints of unspecified foot: Secondary | ICD-10-CM | POA: Insufficient documentation

## 2013-07-16 DIAGNOSIS — K8020 Calculus of gallbladder without cholecystitis without obstruction: Secondary | ICD-10-CM | POA: Insufficient documentation

## 2013-07-16 NOTE — Assessment & Plan Note (Addendum)
History suggestive of bone spur or bone bruise of calcaneus with subsequent ankle ligaments strain from antalgic gait.  Referral to Podiatry foe evaluation since conservative treatment has failed.  Trial of predisone taper and tramadol.

## 2013-07-16 NOTE — Assessment & Plan Note (Signed)
She is overdue fore screening mammogram.

## 2013-07-16 NOTE — Assessment & Plan Note (Signed)
Diagnostic endoscopic evaluation was done in 2013 and several polyps removed for biopsy.  3 yr follow up advised.

## 2013-07-16 NOTE — Assessment & Plan Note (Signed)
I have addressed  BMI and recommended wt loss of 10% of body weight over the next 6 months using a low glycemic index diet and regular exercise a minimum of 5 days per week.   

## 2013-07-16 NOTE — Assessment & Plan Note (Signed)
Her anemia was noted in October by Urgent Care. Repeat hgb today along with iron studies and thyroid screen were all normal.

## 2013-07-19 ENCOUNTER — Encounter: Payer: Self-pay | Admitting: Internal Medicine

## 2013-08-05 ENCOUNTER — Ambulatory Visit: Payer: Self-pay | Admitting: Internal Medicine

## 2013-08-12 ENCOUNTER — Ambulatory Visit: Payer: BC Managed Care – PPO | Admitting: Podiatry

## 2013-09-29 ENCOUNTER — Encounter: Payer: Self-pay | Admitting: Internal Medicine

## 2013-11-17 ENCOUNTER — Other Ambulatory Visit: Payer: Self-pay | Admitting: Cardiovascular Disease

## 2013-12-02 ENCOUNTER — Encounter: Payer: Self-pay | Admitting: Internal Medicine

## 2013-12-02 ENCOUNTER — Ambulatory Visit (INDEPENDENT_AMBULATORY_CARE_PROVIDER_SITE_OTHER): Payer: 59 | Admitting: Internal Medicine

## 2013-12-02 VITALS — BP 120/82 | HR 65 | Ht 64.0 in | Wt 261.8 lb

## 2013-12-02 DIAGNOSIS — R079 Chest pain, unspecified: Secondary | ICD-10-CM

## 2013-12-02 DIAGNOSIS — R002 Palpitations: Secondary | ICD-10-CM

## 2013-12-02 NOTE — Progress Notes (Signed)
      Patient Care Team: Crecencio Mc, MD as PCP - General (Internal Medicine)   HPI  Maria Moore is a 43 y.o. female Seen in followup for symptoms of syncope and sinus tachycardia most consistent with a dysautonomia. The daughter has been diagnosed with long QT syndrome albeit the last year genetic testing was not yet completed  She continues to complain of periodic symptoms of chest discomfort and dyspnea it occurs mostly near the and of her dosing interval with metoprolol  She has had no recurrent syncope  Her daughters testing was not accomplished because of cost.  She has not been able to exercise because of foot injuries.  Past Medical History  Diagnosis Date  . Palpitations   . VTE (venous thromboembolism)     post partum, post procedure, Protein C deficiency ruled out  . Depression   . Anemia   . Plantar fasciitis     Past Surgical History  Procedure Laterality Date  . D&c (other)    . Myringotomy and tubes (other)    . Extraction of wisdom teeth    . Laparoscopic tubal ligation      [place]  . Tubal ligation      Current Outpatient Prescriptions  Medication Sig Dispense Refill  . famotidine (PEPCID) 10 MG tablet Take 10 mg by mouth 2 (two) times daily.      Marland Kitchen ibuprofen (ADVIL,MOTRIN) 600 MG tablet Take 600 mg by mouth every 6 (six) hours as needed.      . metoprolol (LOPRESSOR) 50 MG tablet TAKE ONE TABLET BY MOUTH TWICE DAILY   60 tablet  2  . traMADol (ULTRAM) 50 MG tablet Take 1 tablet (50 mg total) by mouth every 8 (eight) hours as needed.  60 tablet  1   No current facility-administered medications for this visit.    No Known Allergies  Review of Systems negative except from HPI and PMH  Physical Exam BP 120/82  Pulse 65  Ht 5\' 4"  (1.626 m)  Wt 261 lb 12 oz (118.729 kg)  BMI 44.91 kg/m2 Well developed and nourished in no acute distress HENT normal Neck supple with JVP-flat Clear Regular rate and rhythm, no murmurs or  gallops Abd-soft with active BS No Clubbing cyanosis edema Skin-warm and dry A & Oriented  Grossly normal sensory and motor function   ECG today shows normal intervals with a QT interval of 380 ms.  Assessment and  Plan  Dysautonomia/orthostatic intolerance  ? Diagnosis long QT  in a daughter  Exertional chest discomfort dyspnea  Morbid obesity   We will plan to adjust her metoprolol dosing to take in the morning in the early afternoon to see if we can attenuate her afternoon symptoms. We'll undertake Myoview scanning to exclude coronary disease; she is unable to ambulate because of her foot injuries.  If her if her sugar consider water aerobics or bicycle therapy for her morbid obesity.  I have been in touch with the electrophysiologist was in the care of her daughter. We will pursue gene testing to [facility]

## 2013-12-02 NOTE — Patient Instructions (Addendum)
[place]  Your caregiver has ordered a Stress Test with nuclear imaging. The purpose of this test is to evaluate the blood supply to your heart muscle. This procedure is referred to as a "Non-Invasive Stress Test." This is because other than having an IV started in your vein, nothing is inserted or "invades" your body. Cardiac stress tests are done to find areas of poor blood flow to the heart by determining the extent of coronary artery disease (CAD). Some patients exercise on a treadmill, which naturally increases the blood flow to your heart, while others who are  unable to walk on a treadmill due to physical limitations have a pharmacologic/chemical stress agent called Lexiscan . This medicine will mimic walking on a treadmill by temporarily increasing your coronary blood flow.   Please note: these test may take anywhere between 2-4 hours to complete  PLEASE REPORT TO [place] AT THE FIRST DESK WILL DIRECT YOU WHERE TO GO  Date of Procedure:____________05/12/15_________________________  Arrival Time for Procedure:________0745 AM______________________  PLEASE NOTIFY THE OFFICE AT LEAST 24 HOURS IN ADVANCE IF YOU ARE UNABLE TO KEEP YOUR APPOINTMENT.  [phone] AND  PLEASE NOTIFY NUCLEAR MEDICINE AT [facility] AT LEAST 24 HOURS IN ADVANCE IF YOU ARE UNABLE TO KEEP YOUR APPOINTMENT. [phone]  How to prepare for your Myoview test:  1. Do not eat or drink after midnight 2. No caffeine for 24 hours prior to test 3. No smoking 24 hours prior to test. 4. Your medication may be taken with water.  If your doctor stopped a medication because of this test, do not take that medication. 5. Ladies, please do not wear dresses.  Skirts or pants are appropriate. Please wear a short sleeve shirt. 6. No perfume, cologne or lotion. 7. Wear comfortable walking shoes. No heels!  Take your metoprolol in the early afternoon.   Dr. Caryl Comes will follow up with you about gene  testing for your daughter  Your physician wants you to follow-up in: 6 months. You will receive a reminder letter in the mail two months in advance. If you don't receive a letter, please call our office to schedule the follow-up appointment.

## 2013-12-06 ENCOUNTER — Ambulatory Visit (INDEPENDENT_AMBULATORY_CARE_PROVIDER_SITE_OTHER): Payer: 59 | Admitting: Podiatry

## 2013-12-06 ENCOUNTER — Encounter: Payer: Self-pay | Admitting: Podiatry

## 2013-12-06 VITALS — BP 119/80 | HR 62 | Resp 16

## 2013-12-06 DIAGNOSIS — M722 Plantar fascial fibromatosis: Secondary | ICD-10-CM

## 2013-12-06 MED ORDER — METHYLPREDNISOLONE (PAK) 4 MG PO TABS
ORAL_TABLET | ORAL | Status: DC
Start: 2013-12-06 — End: 2014-08-24

## 2013-12-06 MED ORDER — DICLOFENAC SODIUM 75 MG PO TBEC: 75.0000 mg | DELAYED_RELEASE_TABLET | Freq: Two times a day (BID) | ORAL | Status: DC

## 2013-12-06 NOTE — Progress Notes (Signed)
She presents today chief complaint of painful right heel. States it is doing much better and then recurred over the past few weeks.  Objective: Vital signs are stable she is alert and oriented x3. She has pain on palpation medial continued tubercle of the right heel. Redressed confirm a fasciitis.  Assessment: Plantar fasciitis right.  Plan: We started her over with oral steroid and reinjected his right heel. Put her in a plantar fascial brace. She will continue use of the night splint a nice I will followup with her in 6 weeks.

## 2013-12-07 ENCOUNTER — Ambulatory Visit: Payer: Self-pay | Admitting: Internal Medicine

## 2013-12-07 DIAGNOSIS — R079 Chest pain, unspecified: Secondary | ICD-10-CM

## 2013-12-08 ENCOUNTER — Other Ambulatory Visit: Payer: Self-pay

## 2013-12-08 DIAGNOSIS — R079 Chest pain, unspecified: Secondary | ICD-10-CM

## 2013-12-14 ENCOUNTER — Telehealth: Payer: Self-pay | Admitting: *Deleted

## 2013-12-14 ENCOUNTER — Telehealth: Payer: Self-pay

## 2013-12-14 NOTE — Telephone Encounter (Signed)
Please call patient with current lab results.

## 2013-12-14 NOTE — Telephone Encounter (Signed)
Pt would like stress results

## 2013-12-14 NOTE — Telephone Encounter (Signed)
Pt would like stress test results.  

## 2014-01-05 ENCOUNTER — Encounter: Payer: Self-pay | Admitting: Podiatry

## 2014-01-05 ENCOUNTER — Ambulatory Visit (INDEPENDENT_AMBULATORY_CARE_PROVIDER_SITE_OTHER): Payer: 59 | Admitting: Podiatry

## 2014-01-05 VITALS — BP 119/80 | HR 60 | Resp 16

## 2014-01-05 DIAGNOSIS — M722 Plantar fascial fibromatosis: Secondary | ICD-10-CM

## 2014-01-05 MED ORDER — IBUPROFEN 800 MG PO TABS: 800.0000 mg | ORAL_TABLET | Freq: Three times a day (TID) | ORAL | Status: DC

## 2014-01-05 NOTE — Progress Notes (Signed)
She presents today for followup of plantar fasciitis. She states it only hurts with pressure.  Objective: Mild tenderness on palpation of the medial calcaneal tubercle.  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  Plan: She was scanned for a pair orthotics.

## 2014-01-24 ENCOUNTER — Ambulatory Visit (INDEPENDENT_AMBULATORY_CARE_PROVIDER_SITE_OTHER): Payer: 59 | Admitting: Podiatry

## 2014-01-24 DIAGNOSIS — M722 Plantar fascial fibromatosis: Secondary | ICD-10-CM

## 2014-01-24 NOTE — Patient Instructions (Signed)

## 2014-01-24 NOTE — Progress Notes (Signed)
Pt presents for orthotic pick up written and verbal instructions are given 

## 2014-02-21 ENCOUNTER — Ambulatory Visit: Payer: 59 | Admitting: Podiatry

## 2014-02-24 ENCOUNTER — Other Ambulatory Visit: Payer: Self-pay | Admitting: Internal Medicine

## 2014-06-01 ENCOUNTER — Ambulatory Visit (INDEPENDENT_AMBULATORY_CARE_PROVIDER_SITE_OTHER): Payer: 59

## 2014-06-01 ENCOUNTER — Ambulatory Visit (INDEPENDENT_AMBULATORY_CARE_PROVIDER_SITE_OTHER): Payer: 59 | Admitting: Emergency Medicine

## 2014-06-01 VITALS — BP 140/88 | HR 90 | Temp 98.3°F | Resp 18 | Ht 65.0 in | Wt 260.0 lb

## 2014-06-01 DIAGNOSIS — M25532 Pain in left wrist: Secondary | ICD-10-CM

## 2014-06-01 DIAGNOSIS — S63502A Unspecified sprain of left wrist, initial encounter: Secondary | ICD-10-CM

## 2014-06-01 MED ORDER — NAPROXEN SODIUM 550 MG PO TABS: 550.0000 mg | ORAL_TABLET | Freq: Two times a day (BID) | ORAL | Status: DC

## 2014-06-01 NOTE — Patient Instructions (Signed)

## 2014-06-01 NOTE — Progress Notes (Signed)
[facility] [address], [place] [address] [phone]  Date:  06/01/2014   Name:  Maria Moore   DOB:  [birth year]   MRN:  [id number]  PCP:  Deborra Medina, MD    Chief Complaint: Wrist Pain   History of Present Illness:  Maria Moore is a 43 y.o. very pleasant female patient who presents with the following:  Injured yesterday when she slipped on wet marble.  [place]. Pain in distal radius worse with lifting. No ecchymosis No improvement with over the counter medications or other home remedies.  Denies other complaint or health concern today.   Patient Active Problem List   Diagnosis Date Noted  . Cholelithiasis without obstruction 07/16/2013  . Pain in joint, ankle and foot 07/16/2013  . Anemia 07/16/2013  . Personal history of colonic polyps 07/16/2013  . Screening for breast cancer 07/16/2013  . S/P endometrial ablation 07/14/2013  . Obesity, Class III, BMI 40-49.9 (morbid obesity) 05/23/2012  . Shortness of breath 03/27/2012  . 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 Fatigue 03/16/2012  . VTE (venous thromboembolism)   . History of DVT of lower extremity 06/11/2011  . S/P tubal ligation 06/11/2011  . PALPITATIONS 07/04/2008    Past Medical History  Diagnosis Date  . Palpitations   . VTE (venous thromboembolism)     post partum, post procedure, Protein C deficiency ruled out  . Depression   . Anemia   . Plantar fasciitis     Past Surgical History  Procedure Laterality Date  . D&c (other)    . Myringotomy and tubes (other)    . Extraction of wisdom teeth    . Laparoscopic tubal ligation      [place]  . Tubal ligation      History  Substance Use Topics  . Smoking status: Never Smoker   . Smokeless tobacco: Never Used  . Alcohol Use: No    Family History  Problem Relation Age of Onset  . COPD Mother   . Cancer Mother   . Early death Mother 62    Died at [facility] 2011-05-15  .  Hypertension Father   . Kidney disease Father     renal calculi  . Heart disease Father 51    4 vessel CABG   . Cancer Maternal Grandmother     breast cancer  . Stroke Maternal Grandfather   . Mental illness Paternal Grandmother     No Known Allergies  Medication list has been reviewed and updated.  Current Outpatient Prescriptions on File Prior to Visit  Medication Sig Dispense Refill  . metoprolol (LOPRESSOR) 50 MG tablet TAKE ONE TABLET BY MOUTH TWICE DAILY  60 tablet 3  . diclofenac (VOLTAREN) 75 MG EC tablet Take 1 tablet (75 mg total) by mouth 2 (two) times daily. 60 tablet 3  . famotidine (PEPCID) 10 MG tablet Take 10 mg by mouth 2 (two) times daily.    Marland Kitchen ibuprofen (ADVIL,MOTRIN) 600 MG tablet Take 600 mg by mouth every 6 (six) hours as needed.    Marland Kitchen ibuprofen (ADVIL,MOTRIN) 800 MG tablet Take 1 tablet (800 mg total) by mouth 3 (three) times daily. 90 tablet 0  . methylPREDNIsolone (MEDROL DOSPACK) 4 MG tablet follow package directions 21 tablet 0  . traMADol (ULTRAM) 50 MG tablet Take 1 tablet (50 mg total) by mouth every 8 (eight) hours as needed. 60 tablet 1   No current facility-administered medications on file prior to visit.  Review of Systems:  As per HPI, otherwise negative.    Physical Examination: Filed Vitals:   06/01/14 1656  BP: 140/88  Pulse: 90  Temp: 98.3 F (36.8 C)  Resp: 18   Filed Vitals:   06/01/14 1656  Height: 5\' 5"  (1.651 m)  Weight: 260 lb (117.935 kg)   Body mass index is 43.27 kg/(m^2). Ideal Body Weight: Weight in (lb) to have BMI = 25: 149.9   GEN: WDWN, NAD, Non-toxic, Alert & Oriented x 3 HEENT: Atraumatic, Normocephalic.  Ears and Nose: No external deformity. EXTR: No clubbing/cyanosis/edema NEURO: Normal gait.  PSYCH: Normally interactive. Conversant. Not depressed or anxious appearing.  Calm demeanor.  LEFT wrist:  Tender left wrist with no deformity.  Guards.  Assessment and Plan: Sprain wrist ??navicular Thumb  spica Follow up in one week Anaprox    Signed,  Ellison Carwin, MD    [facility] reading (PRIMARY) by  Dr. Ouida Sills.  negative.

## 2014-06-05 ENCOUNTER — Ambulatory Visit (INDEPENDENT_AMBULATORY_CARE_PROVIDER_SITE_OTHER): Payer: 59 | Admitting: Emergency Medicine

## 2014-06-05 ENCOUNTER — Ambulatory Visit (INDEPENDENT_AMBULATORY_CARE_PROVIDER_SITE_OTHER): Payer: 59

## 2014-06-05 VITALS — BP 122/82 | HR 80 | Temp 98.4°F | Resp 16 | Ht 65.75 in | Wt 259.8 lb

## 2014-06-05 DIAGNOSIS — M25532 Pain in left wrist: Secondary | ICD-10-CM

## 2014-06-05 MED ORDER — ACETAMINOPHEN-CODEINE #3 300-30 MG PO TABS: 1.0000 | ORAL_TABLET | ORAL | Status: DC | PRN

## 2014-06-05 NOTE — Progress Notes (Signed)
[facility] [address] [place] [address] [phone]  Date:  06/05/2014   Name:  Maria Moore   DOB:  04-14-1971   MRN:  [id number]  PCP:  Deborra Medina, MD    Chief Complaint: Follow-up   History of Present Illness:  Maria Moore is a 43 y.o. very pleasant female patient who presents with the following:  [place] last week.  Splinted in thumb spica.  Still has pain in base of thumb and wrist despite splint Some swelling Denies other complaint or health concern today.   Patient Active Problem List   Diagnosis Date Noted  . Cholelithiasis without obstruction 07/16/2013  . Pain in joint, ankle and foot 07/16/2013  . Anemia 07/16/2013  . Personal history of colonic polyps 07/16/2013  . Screening for breast cancer 07/16/2013  . S/P endometrial ablation 07/14/2013  . Obesity, Class III, BMI 40-49.9 (morbid obesity) 05/23/2012  . Shortness of breath 03/27/2012  . 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 Fatigue 03/16/2012  . VTE (venous thromboembolism)   . History of DVT of lower extremity 06/11/2011  . S/P tubal ligation 06/11/2011  . PALPITATIONS 07/04/2008    Past Medical History  Diagnosis Date  . Palpitations   . VTE (venous thromboembolism)     post partum, post procedure, Protein C deficiency ruled out  . Depression   . Anemia   . Plantar fasciitis     Past Surgical History  Procedure Laterality Date  . D&c (other)    . Myringotomy and tubes (other)    . Extraction of wisdom teeth    . Laparoscopic tubal ligation      [place]  . Tubal ligation      History  Substance Use Topics  . Smoking status: Never Smoker   . Smokeless tobacco: Never Used  . Alcohol Use: No    Family History  Problem Relation Age of Onset  . COPD Mother   . Cancer Mother   . Early death Mother 32    Died at [facility] May 21, 2011  . Hypertension Father   . Kidney disease Father     renal calculi  . Heart  disease Father 22    4 vessel CABG   . Cancer Maternal Grandmother     breast cancer  . Stroke Maternal Grandfather   . Mental illness Paternal Grandmother     No Known Allergies  Medication list has been reviewed and updated.  Current Outpatient Prescriptions on File Prior to Visit  Medication Sig Dispense Refill  . diclofenac (VOLTAREN) 75 MG EC tablet Take 1 tablet (75 mg total) by mouth 2 (two) times daily. 60 tablet 3  . famotidine (PEPCID) 10 MG tablet Take 10 mg by mouth 2 (two) times daily.    Marland Kitchen ibuprofen (ADVIL,MOTRIN) 600 MG tablet Take 600 mg by mouth every 6 (six) hours as needed.    Marland Kitchen ibuprofen (ADVIL,MOTRIN) 800 MG tablet Take 1 tablet (800 mg total) by mouth 3 (three) times daily. 90 tablet 0  . methylPREDNIsolone (MEDROL DOSPACK) 4 MG tablet follow package directions 21 tablet 0  . metoprolol (LOPRESSOR) 50 MG tablet TAKE ONE TABLET BY MOUTH TWICE DAILY  60 tablet 3  . naproxen sodium (ANAPROX DS) 550 MG tablet Take 1 tablet (550 mg total) by mouth 2 (two) times daily with a meal. 40 tablet 0  . traMADol (ULTRAM) 50 MG tablet Take 1 tablet (50 mg total) by mouth every 8 (eight) hours  as needed. 60 tablet 1   No current facility-administered medications on file prior to visit.    Review of Systems:  As per HPI, otherwise negative.    Physical Examination: Filed Vitals:   06/05/14 1129  BP: 122/82  Pulse: 80  Temp: 98.4 F (36.9 C)  Resp: 16   Filed Vitals:   06/05/14 1129  Height: 5' 5.75" (1.67 m)  Weight: 259 lb 12.8 oz (117.845 kg)   Body mass index is 42.26 kg/(m^2). Ideal Body Weight: Weight in (lb) to have BMI = 25: 153.4   GEN: WDWN, NAD, Non-toxic, Alert & Oriented x 3 HEENT: Atraumatic, Normocephalic.  Ears and Nose: No external deformity. EXTR: No clubbing/cyanosis/edema NEURO: Normal gait.  PSYCH: Normally interactive. Conversant. Not depressed or anxious appearing.  Calm demeanor.  LEFT wrist:  Ecchymosis base of thumb and  tenderness  Assessment and Plan: ?navicular fracture Continue splint Ortho  Signed,  Ellison Carwin, MD   [facility] reading (PRIMARY) by  Dr. Ouida Sills.  negative.

## 2014-06-05 NOTE — Patient Instructions (Signed)

## 2014-07-04 ENCOUNTER — Other Ambulatory Visit: Payer: Self-pay

## 2014-07-04 MED ORDER — METOPROLOL TARTRATE 50 MG PO TABS: 50.0000 mg | ORAL_TABLET | Freq: Two times a day (BID) | ORAL | Status: DC

## 2014-07-04 NOTE — Telephone Encounter (Signed)
Refill sent for metoprolol tart  

## 2014-07-19 ENCOUNTER — Encounter: Payer: Self-pay | Admitting: *Deleted

## 2014-08-24 ENCOUNTER — Ambulatory Visit (INDEPENDENT_AMBULATORY_CARE_PROVIDER_SITE_OTHER): Payer: 59 | Admitting: Internal Medicine

## 2014-08-24 ENCOUNTER — Encounter: Payer: Self-pay | Admitting: Internal Medicine

## 2014-08-24 VITALS — BP 122/84 | HR 71 | Resp 12 | Ht 65.75 in | Wt 260.5 lb

## 2014-08-24 DIAGNOSIS — F418 Other specified anxiety disorders: Secondary | ICD-10-CM

## 2014-08-24 DIAGNOSIS — S52121S Displaced fracture of head of right radius, sequela: Secondary | ICD-10-CM

## 2014-08-24 MED ORDER — ALPRAZOLAM 0.5 MG PO TABS: 0.5000 mg | ORAL_TABLET | Freq: Every evening | ORAL | Status: DC | PRN

## 2014-08-24 MED ORDER — CITALOPRAM HYDROBROMIDE 10 MG PO TABS: 10.0000 mg | ORAL_TABLET | Freq: Every day | ORAL | Status: DC

## 2014-08-24 NOTE — Progress Notes (Signed)
Patient ID: Maria Moore, female   DOB: 1971/04/27, 44 y.o.   MRN: [id number]   Patient Active Problem List   Diagnosis Date Noted  . Depression with anxiety 08/25/2014  . Fracture of radial head, closed 08/25/2014  . Cholelithiasis without obstruction 07/16/2013  . Pain in joint, ankle and foot 07/16/2013  . Anemia 07/16/2013  . Personal history of colonic polyps 07/16/2013  . Screening for breast cancer 07/16/2013  . S/P endometrial ablation 07/14/2013  . Obesity, Class III, BMI 40-49.9 (morbid obesity) 05/23/2012  . Shortness of breath 03/27/2012  . 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 Fatigue 03/16/2012  . VTE (venous thromboembolism)   . History of DVT of lower extremity 06/11/2011  . S/P tubal ligation 06/11/2011  . PALPITATIONS 07/04/2008    Subjective:  CC:   Chief Complaint  Patient presents with  . Depression    HPI:   Maria Moore is a 44 y.o. female who presents for Several issues to discuss   1) depression new onset,  Son diagnosed with autism in September and was moved from a special needs school to [employer] .  The transition has been a nightmare Her Dad passed away in early 08-15-2023, died from a massive MI while on his tread mill.  At age of 78 .  He had undergone a 4 vessel CABG a year or so prior. .  CPR done by EMS.  Has taken several medications in the past for depression/anxiety, including zoloft in the past,  Caused weight gain.  wellbutrin tolerated,  paxil not tolerated,  prozac ineffective   2) history of left radial fracture  soft casted until late 08-15-23.  No pain,  Strength   In November during field trip,  [place] onto outstretched hand [facility]) and walked around for 24 hrs before she went to Urgent Care following day   Past Medical History  Diagnosis Date  . Palpitations   . VTE (venous thromboembolism)     post partum, post procedure, Protein C deficiency ruled out  . Depression   . Anemia    . Plantar fasciitis     Past Surgical History  Procedure Laterality Date  . D&c (other)    . Myringotomy and tubes (other)    . Extraction of wisdom teeth    . Laparoscopic tubal ligation      [place]  . Tubal ligation         The following portions of the patient's history were reviewed and updated as appropriate: Allergies, current medications, and problem list.    Review of Systems:   Patient denies headache, fevers, malaise, unintentional weight loss, skin rash, eye pain, sinus congestion and sinus pain, sore throat, dysphagia,  hemoptysis , cough, dyspnea, wheezing, chest pain, palpitations, orthopnea, edema, abdominal pain, nausea, melena, diarrhea, constipation, flank pain, dysuria, hematuria, urinary  Frequency, nocturia, numbness, tingling, seizures,  Focal weakness, Loss of consciousness,  Tremor, insomnia, depression, anxiety, and suicidal ideation.     History   Social History  . Marital Status: Married    Spouse Name: N/A    Number of Children: N/A  . Years of Education: N/A   Occupational History  . Not on file.   Social History Main Topics  . Smoking status: Never Smoker   . Smokeless tobacco: Never Used  . Alcohol Use: No  . Drug Use: No  . Sexual Activity: Yes    Birth Control/ Protection: None   Other Topics Concern  . 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     Objective:  Filed Vitals:   08/24/14 0947  BP: 122/84  Pulse: 71  Resp: 12     General appearance: alert, cooperative and appears stated age Ears: normal TM's and external ear canals both ears Throat: lips, mucosa, and tongue normal; teeth and gums normal Neck: no adenopathy, no carotid bruit, supple, symmetrical, trachea midline and thyroid not enlarged, symmetric, no tenderness/mass/nodules Back: symmetric, no curvature. ROM normal. No CVA tenderness. Lungs: clear to  auscultation bilaterally Heart: regular rate and rhythm, S1, S2 normal, no murmur, click, rub or gallop Abdomen: soft, non-tender; bowel sounds normal; no masses,  no organomegaly Pulses: 2+ and symmetric Skin: Skin color, texture, turgor normal. No rashes or lesions Lymph nodes: Cervical, supraclavicular, and axillary nodes normal. Psych: affect normal, makes good eye contact. No fidgeting,  Smiles easily.  Denies suicidal thoughts    Assessment and Plan:  Depression with anxiety Citalopram with alprazolam prescribed.   A total of 25 minutes of face to face time was spent with patient more than half of which was spent in counselling and coordination of care   Updated Medication List Outpatient Encounter Prescriptions as of 08/24/2014  Medication Sig  . cholecalciferol (VITAMIN D) 1000 UNITS tablet Take 1,000 Units by mouth daily.  . metoprolol (LOPRESSOR) 50 MG tablet Take 1 tablet (50 mg total) by mouth 2 (two) times daily.  . ranitidine (ZANTAC) 150 MG tablet Take 150 mg by mouth 2 (two) times daily.  Marland Kitchen ALPRAZolam (XANAX) 0.5 MG tablet Take 1 tablet (0.5 mg total) by mouth at bedtime as needed for anxiety.  . citalopram (CELEXA) 10 MG tablet Take 1 tablet (10 mg total) by mouth daily.  . [DISCONTINUED] acetaminophen-codeine (TYLENOL #3) 300-30 MG per tablet Take 1-2 tablets by mouth every 4 (four) hours as needed.  . [DISCONTINUED] diclofenac (VOLTAREN) 75 MG EC tablet Take 1 tablet (75 mg total) by mouth 2 (two) times daily.  . [DISCONTINUED] famotidine (PEPCID) 10 MG tablet Take 10 mg by mouth 2 (two) times daily.  . [DISCONTINUED] ibuprofen (ADVIL,MOTRIN) 600 MG tablet Take 600 mg by mouth every 6 (six) hours as needed.  . [DISCONTINUED] ibuprofen (ADVIL,MOTRIN) 800 MG tablet Take 1 tablet (800 mg total) by mouth 3 (three) times daily.  . [DISCONTINUED] methylPREDNIsolone (MEDROL DOSPACK) 4 MG tablet follow package directions  . [DISCONTINUED] naproxen sodium (ANAPROX DS) 550 MG  tablet Take 1 tablet (550 mg total) by mouth 2 (two) times daily with a meal.  . [DISCONTINUED] traMADol (ULTRAM) 50 MG tablet Take 1 tablet (50 mg total) by mouth every 8 (eight) hours as needed.     No orders of the defined types were placed in this encounter.    No Follow-up on file.

## 2014-08-24 NOTE — Progress Notes (Signed)
Pre visit review using our clinic review tool, if applicable. No additional management support is needed unless otherwise documented below in the visit note. 

## 2014-08-24 NOTE — Patient Instructions (Signed)
I am starting you on citalopram,  Start with 1/2 tablet daily for the first 4 days,  Then increase to a full tablet (10 mg ) .  You can increase to 20 mg after two weeks on the 10 mg dose  The alprazolam can be used daily if needed for panic attacks or insomnia.  Start with 1/2 tablet

## 2014-08-25 ENCOUNTER — Encounter: Payer: Self-pay | Admitting: Internal Medicine

## 2014-08-25 DIAGNOSIS — S52123A Displaced fracture of head of unspecified radius, initial encounter for closed fracture: Secondary | ICD-10-CM | POA: Insufficient documentation

## 2014-08-25 DIAGNOSIS — F418 Other specified anxiety disorders: Secondary | ICD-10-CM | POA: Insufficient documentation

## 2014-08-25 NOTE — Assessment & Plan Note (Signed)
Citalopram with alprazolam prescribed.

## 2014-09-18 ENCOUNTER — Ambulatory Visit (INDEPENDENT_AMBULATORY_CARE_PROVIDER_SITE_OTHER): Payer: 59 | Admitting: Physician Assistant

## 2014-09-18 VITALS — BP 134/84 | HR 88 | Temp 98.1°F | Resp 16 | Ht 65.75 in | Wt 258.6 lb

## 2014-09-18 DIAGNOSIS — J0100 Acute maxillary sinusitis, unspecified: Secondary | ICD-10-CM

## 2014-09-18 MED ORDER — IPRATROPIUM BROMIDE 0.03 % NA SOLN: 2.0000 | Freq: Two times a day (BID) | NASAL | Status: DC

## 2014-09-18 MED ORDER — AMOXICILLIN-POT CLAVULANATE 875-125 MG PO TABS: 1.0000 | ORAL_TABLET | Freq: Two times a day (BID) | ORAL | Status: AC

## 2014-09-18 NOTE — Progress Notes (Signed)
Subjective:    Patient ID: [name], female    DOB: November 18, 1970, 44 y.o.   MRN: [id number]   PCP: Crecencio Mc, MD  Chief Complaint  Patient presents with  . URI    x 3 weeks, worse past few days   . Ear Pain  . Headache  . Dental Pain    No Known Allergies  Patient Active Problem List   Diagnosis Date Noted  . Depression with anxiety 08/25/2014  . Fracture of radial head, closed 08/25/2014  . Cholelithiasis without obstruction 07/16/2013  . Pain in joint, ankle and foot 07/16/2013  . Anemia 07/16/2013  . Personal history of colonic polyps 07/16/2013  . Screening for breast cancer 07/16/2013  . S/P endometrial ablation 07/14/2013  . Obesity, Class III, BMI 40-49.9 (morbid obesity) 05/23/2012  . Shortness of breath 03/27/2012  . 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 Fatigue 03/16/2012  . VTE (venous thromboembolism)   . History of DVT of lower extremity 06/11/2011  . S/P tubal ligation 06/11/2011  . PALPITATIONS 07/04/2008    Prior to Admission medications   Medication Sig Start Date End Date Taking? Authorizing Provider  cholecalciferol (VITAMIN D) 1000 UNITS tablet Take 1,000 Units by mouth daily.   Yes Historical Provider, MD  citalopram (CELEXA) 10 MG tablet Take 1 tablet (10 mg total) by mouth daily. 08/24/14  Yes Crecencio Mc, MD  metoprolol (LOPRESSOR) 50 MG tablet Take 1 tablet (50 mg total) by mouth 2 (two) times daily. 07/04/14  Yes Deboraha Sprang, MD  ranitidine (ZANTAC) 150 MG tablet Take 150 mg by mouth 2 (two) times daily.   Yes Historical Provider, MD  ALPRAZolam Duanne Moron) 0.5 MG tablet Take 1 tablet (0.5 mg total) by mouth at bedtime as needed for anxiety. Patient not taking: Reported on 09/18/2014 08/24/14   Crecencio Mc, MD    Medical, Surgical, Family and Social History reviewed and updated.  HPI  Presents with 3 weeks of sinus pressure, drainage and congestion. Has been worse for the past several  days. Using OTC Sudafed, Mucinex, Afrin, which have helped temporarily.  Had to leave work early Friday 2/19 due to increasing facial pain, HA and teeth hurting. This morning feels worse-now having pain into the LEFT ear and neck.  No fever, chills, nausea, vomiting or diarrhea. No cough. No sore throat.  Review of Systems No CP, SOB, dizziness.    Objective:   Physical Exam  Constitutional: She is oriented to person, place, and time. Vital signs are normal. She appears well-developed and well-nourished. She is active and cooperative. No distress.  BP 134/84 mmHg  Pulse 88  Temp(Src) 98.1 F (36.7 C) (Oral)  Resp 16  Ht 5' 5.75" (1.67 m)  Wt 258 lb 9.6 oz (117.3 kg)  BMI 42.06 kg/m2  SpO2 97%  LMP 08/23/2014   HENT:  Head: Normocephalic and atraumatic.  Right Ear: Hearing, tympanic membrane, external ear and ear canal normal.  Left Ear: Hearing, tympanic membrane, external ear and ear canal normal.  Nose: Mucosal edema and rhinorrhea present.  No foreign bodies. Right sinus exhibits maxillary sinus tenderness. Right sinus exhibits no frontal sinus tenderness. Left sinus exhibits maxillary sinus tenderness. Left sinus exhibits no frontal sinus tenderness.  Mouth/Throat: Uvula is midline, oropharynx is clear and moist and mucous membranes are normal. No uvula swelling. No oropharyngeal exudate.  Eyes: Conjunctivae and EOM are normal. Pupils are equal, round, and reactive to light. Right eye exhibits no discharge. Left  eye exhibits no discharge. No scleral icterus.  Neck: Trachea normal, normal range of motion and full passive range of motion without pain. Neck supple. No thyroid mass and no thyromegaly present.  Cardiovascular: Normal rate, regular rhythm and normal heart sounds.   Pulmonary/Chest: Effort normal and breath sounds normal.  Lymphadenopathy:       Head (right side): No submandibular, no tonsillar, no preauricular, no posterior auricular and no occipital adenopathy  present.       Head (left side): No submandibular, no tonsillar, no preauricular and no occipital adenopathy present.    She has no cervical adenopathy.       Right: No supraclavicular adenopathy present.       Left: No supraclavicular adenopathy present.  Neurological: She is alert and oriented to person, place, and time. She has normal strength. No cranial nerve deficit or sensory deficit.  Skin: Skin is warm, dry and intact. No rash noted.  Psychiatric: She has a normal mood and affect. Her speech is normal and behavior is normal.          Assessment & Plan:  1. Subacute maxillary sinusitis Supportive care.  Anticipatory guidance.  RTC if symptoms worsen/persist. Stop Afrin.Continue Mucinex. - ipratropium (ATROVENT) 0.03 % nasal spray; Place 2 sprays into both nostrils 2 (two) times daily.  Dispense: 30 mL; Refill: 0 - amoxicillin-clavulanate (AUGMENTIN) 875-125 MG per tablet; Take 1 tablet by mouth 2 (two) times daily.  Dispense: 20 tablet; Refill: 0   Fara Chute, PA-C Physician Assistant-Certified [facility]

## 2014-09-18 NOTE — Patient Instructions (Signed)
Get plenty of rest and drink at least 64 ounces of water daily. Use Mucinex Maximum Strength to thin the mucous (not the "D" or "DM"). Stop the Afrin.

## 2014-11-11 ENCOUNTER — Other Ambulatory Visit: Payer: Self-pay | Admitting: Internal Medicine

## 2014-11-15 ENCOUNTER — Ambulatory Visit (INDEPENDENT_AMBULATORY_CARE_PROVIDER_SITE_OTHER): Payer: 59 | Admitting: Internal Medicine

## 2014-11-15 ENCOUNTER — Encounter: Payer: Self-pay | Admitting: Internal Medicine

## 2014-11-15 VITALS — BP 122/98 | HR 90 | Ht 64.0 in | Wt 259.0 lb

## 2014-11-15 DIAGNOSIS — F329 Major depressive disorder, single episode, unspecified: Secondary | ICD-10-CM | POA: Diagnosis not present

## 2014-11-15 DIAGNOSIS — R002 Palpitations: Secondary | ICD-10-CM | POA: Diagnosis not present

## 2014-11-15 DIAGNOSIS — R55 Syncope and collapse: Secondary | ICD-10-CM | POA: Diagnosis not present

## 2014-11-15 DIAGNOSIS — F32A Depression, unspecified: Secondary | ICD-10-CM

## 2014-11-15 MED ORDER — METOPROLOL TARTRATE 50 MG PO TABS: 50.0000 mg | ORAL_TABLET | Freq: Two times a day (BID) | ORAL | Status: DC

## 2014-11-15 NOTE — Progress Notes (Signed)
Electrophysiology Office Note   Date:  11/15/2014   ID:  Maria Moore, DOB 03-13-1971, MRN [id number]  PCP:  Crecencio Mc, MD  Cardiologist:    Primary Electrophysiologist:  Virl Axe, MD    Chief Complaint  Patient presents with  . other    6 month f/u no complaints. Meds reviewed verbally with pt.     History of Present Illness: Maria Moore is a 44 y.o. female  Seen in followup for symptoms of syncope and sinus tachycardia most consistent with a dysautonomia. Her daughter has been diagnosed with long QT syndrome albeit the last year genetic testing was not yet completed  She continues to complain of periodic symptoms of chest discomfort and dyspnea it occurs mostly near the and of her dosing interval with metoprolol.  myoview 2015 Normal   She has had no recurrent syncope  Her daughters testing was not accomplished because of cost.  She has not been able to exercise because of foot injuries.   Today, she denies symptoms of  chest pain, shortness of breath, orthopnea, PND, lower extremity edema, bleeding, or neurologic sequela.  she has no  complaints of palpitations, lightheadedness presyncope or syncope as long as she takes her betablockers  The patient is tolerating medications without difficulties and is otherwise without complaint today.   I have been in touch w her daughters EP Dr Salomon Fick re genetic testing apparently not yet consummated   Past Medical History  Diagnosis Date  . Palpitations   . VTE (venous thromboembolism)     post partum, post procedure, Protein C deficiency ruled out  . Depression   . Anemia   . Plantar fasciitis    Past Surgical History  Procedure Laterality Date  . D&c (other)    . Myringotomy and tubes (other)    . Extraction of wisdom teeth    . Laparoscopic tubal ligation      [place]  . Tubal ligation       Current Outpatient Prescriptions  Medication Sig Dispense Refill  . cholecalciferol (VITAMIN D) 1000 UNITS  tablet Take 1,000 Units by mouth daily.    . citalopram (CELEXA) 10 MG tablet Take 1 tablet (10 mg total) by mouth daily. 901 tablet 1  . metoprolol (LOPRESSOR) 50 MG tablet Take 1 tablet (50 mg total) by mouth 2 (two) times daily. 60 tablet 3  . ranitidine (ZANTAC) 150 MG tablet Take 150 mg by mouth 2 (two) times daily.     No current facility-administered medications for this visit.    Allergies:   Review of patient's allergies indicates no known allergies.   Social History:  The patient  reports that she has never smoked. She has never used smokeless tobacco. She reports that she does not drink alcohol or use illicit drugs.   Family History:  The patient's    family history includes COPD in her mother; Cancer in her maternal grandmother and mother; Early death (age of onset: 31) in her mother; Heart disease (age of onset: 73) in her father; Hypertension in her father; Kidney disease in her father; Mental illness in her paternal grandmother; Stroke in her maternal grandfather.    ROS:  Please see the history of present illness.   Otherwise, review of systems is negative.    PHYSICAL EXAM: VS:  BP 122/98 mmHg  Pulse 90  Ht 5\' 4"  (1.626 m)  Wt 259 lb (117.482 kg)  BMI 44.44 kg/m2 , BMI Body mass index is 44.44  kg/(m^2). GEN: Well nourished, well developed, in no acute distress HEENT: normal Neck: JVD flat, carotid bruits, or masses Cardiac: R RR;  no murmur  rubs,noS4  Respiratory:  clear to auscultation bilaterally, normal work of breathing Back without kyphosis or CVAT GI: soft, nontender, nondistended, + BS MS: no deformity or atrophy Skin: warm and dry,   Extremities No Clubbing cyanosis  Edema Neuro:  Strength and sensation are intact Psych: euthymic mood, full affect  EKG:  EKG is ordered today. The ekg ordered today shows NSR with QTx 390   Recent Labs: No results found for requested labs within last 365 days.    Lipid Panel     Component Value Date/Time   CHOL  219* 06/11/2011 1400   TRIG 91.0 06/11/2011 1400   HDL 51.40 06/11/2011 1400   CHOLHDL 4 06/11/2011 1400   VLDL 18.2 06/11/2011 1400   LDLDIRECT 164.4 06/11/2011 1400     Wt Readings from Last 3 Encounters:  11/15/14 259 lb (117.482 kg)  09/18/14 258 lb 9.6 oz (117.3 kg)  08/24/14 260 lb 8 oz (118.162 kg)      Other studies Reviewed: Additional studies/ records that were reviewed today include: As above   Review of the above records today demonstrates:     ASSESSMENT AND PLAN:  Dysautonomia/orthostatic intolerance  ? Diagnosis long QT in a daughter  Exertional chest discomfort dyspnea  Depression   We have discussed the importance of followup of genetic testing We will refill her BB which has kept her dysautonomic symptoms at [place]  She has had a very stressful year and her deprssion is better, following the diagnosis of Asperger in her son and the unexpected death of her father in 08-12-2023     Current medicines are reviewed at length with the patient today.   The patient does not have concerns regarding her medicines.  The following changes were made today:  none  Labs/ tests ordered today include:   Orders Placed This Encounter  Procedures  . EKG 12-Lead     Disposition:   FU with me 1 year(s)   Signed, Virl Axe, MD  11/15/2014 8:41 AM     [facility] [address] [place] [address] [phone] (office) [phone] (fax)

## 2014-11-15 NOTE — Patient Instructions (Signed)
Your physician recommends that you continue on your current medications as directed. Please refer to the Current Medication list given to you today.  Your physician wants you to follow-up in: 1 year with Dr. Klein.  You will receive a reminder letter in the mail two months in advance. If you don't receive a letter, please call our office to schedule the follow-up appointment.  

## 2015-01-10 ENCOUNTER — Encounter: Payer: Self-pay | Admitting: Internal Medicine

## 2015-01-11 ENCOUNTER — Other Ambulatory Visit: Payer: Self-pay | Admitting: Nurse Practitioner

## 2015-01-11 MED ORDER — CITALOPRAM HYDROBROMIDE 20 MG PO TABS: 20.0000 mg | ORAL_TABLET | Freq: Every day | ORAL | Status: DC

## 2015-02-27 ENCOUNTER — Encounter: Payer: Self-pay | Admitting: Internal Medicine

## 2015-02-27 ENCOUNTER — Ambulatory Visit (INDEPENDENT_AMBULATORY_CARE_PROVIDER_SITE_OTHER): Payer: 59 | Admitting: Internal Medicine

## 2015-02-27 VITALS — BP 138/84 | HR 61 | Temp 98.3°F | Resp 12 | Ht 64.0 in | Wt 263.5 lb

## 2015-02-27 DIAGNOSIS — R5383 Other fatigue: Secondary | ICD-10-CM | POA: Diagnosis not present

## 2015-02-27 DIAGNOSIS — E559 Vitamin D deficiency, unspecified: Secondary | ICD-10-CM | POA: Diagnosis not present

## 2015-02-27 DIAGNOSIS — J0100 Acute maxillary sinusitis, unspecified: Secondary | ICD-10-CM

## 2015-02-27 DIAGNOSIS — H8101 Meniere's disease, right ear: Secondary | ICD-10-CM

## 2015-02-27 DIAGNOSIS — J322 Chronic ethmoidal sinusitis: Secondary | ICD-10-CM

## 2015-02-27 DIAGNOSIS — H81391 Other peripheral vertigo, right ear: Secondary | ICD-10-CM | POA: Diagnosis not present

## 2015-02-27 DIAGNOSIS — H8109 Meniere's disease, unspecified ear: Secondary | ICD-10-CM | POA: Insufficient documentation

## 2015-02-27 LAB — C-REACTIVE PROTEIN: CRP: 0.9 mg/dL (ref 0.5–20.0)

## 2015-02-27 LAB — COMPREHENSIVE METABOLIC PANEL
ALT: 29 U/L (ref 0–35)
AST: 23 U/L (ref 0–37)
Albumin: 4.1 g/dL (ref 3.5–5.2)
Alkaline Phosphatase: 65 U/L (ref 39–117)
BUN: 14 mg/dL (ref 6–23)
CO2: 28 mEq/L (ref 19–32)
Calcium: 9.4 mg/dL (ref 8.4–10.5)
Chloride: 102 mEq/L (ref 96–112)
Creatinine, Ser: 0.77 mg/dL (ref 0.40–1.20)
GFR: 85.75 mL/min (ref >60.00)
Glucose, Bld: 69 mg/dL — ABNORMAL LOW (ref 70–99)
Potassium: 4.2 mEq/L (ref 3.5–5.1)
Sodium: 139 mEq/L (ref 135–145)
Total Bilirubin: 0.5 mg/dL (ref 0.2–1.2)
Total Protein: 6.8 g/dL (ref 6.0–8.3)

## 2015-02-27 LAB — CBC WITH DIFFERENTIAL/PLATELET
Basophils Absolute: 0 10*3/uL (ref 0.0–0.1)
Basophils Relative: 0.4 % (ref 0.0–3.0)
Eosinophils Absolute: 0.2 10*3/uL (ref 0.0–0.7)
Eosinophils Relative: 2.5 % (ref 0.0–5.0)
HCT: 41.4 % (ref 36.0–46.0)
Hemoglobin: 13.9 g/dL (ref 12.0–15.0)
Lymphocytes Relative: 27.5 % (ref 12.0–46.0)
Lymphs Abs: 1.7 10*3/uL (ref 0.7–4.0)
MCHC: 33.6 g/dL (ref 30.0–36.0)
MCV: 87.9 fl (ref 78.0–100.0)
Monocytes Absolute: 0.4 10*3/uL (ref 0.1–1.0)
Monocytes Relative: 5.8 % (ref 3.0–12.0)
Neutro Abs: 4 10*3/uL (ref 1.4–7.7)
Neutrophils Relative %: 63.8 % (ref 43.0–77.0)
Platelets: 194 10*3/uL (ref 150.0–400.0)
RBC: 4.71 Mil/uL (ref 3.87–5.11)
RDW: 14 % (ref 11.5–15.5)
WBC: 6.3 10*3/uL (ref 4.0–10.5)

## 2015-02-27 LAB — SEDIMENTATION RATE: Sed Rate: 21 mm/hr (ref 0–22)

## 2015-02-27 LAB — TSH: TSH: 1.86 u[IU]/mL (ref 0.35–4.50)

## 2015-02-27 LAB — VITAMIN D 25 HYDROXY (VIT D DEFICIENCY, FRACTURES): VITD: 25.51 ng/mL — ABNORMAL LOW (ref 30.00–100.00)

## 2015-02-27 MED ORDER — AMOXICILLIN-POT CLAVULANATE 875-125 MG PO TABS: 1.0000 | ORAL_TABLET | Freq: Two times a day (BID) | ORAL | Status: DC

## 2015-02-27 MED ORDER — PREDNISONE 10 MG PO TABS: ORAL_TABLET | ORAL | Status: DC

## 2015-02-27 MED ORDER — MECLIZINE HCL 25 MG PO TABS: 25.0000 mg | ORAL_TABLET | Freq: Three times a day (TID) | ORAL | Status: DC | PRN

## 2015-02-27 NOTE — Progress Notes (Signed)
Pre-visit discussion using our clinic review tool. No additional management support is needed unless otherwise documented below in the visit note.  

## 2015-02-27 NOTE — Progress Notes (Signed)
Subjective:  Patient ID: Maria Moore, female    DOB: 1971/07/09  Age: 44 y.o. MRN: [id number]  CC: There were no encounter diagnoses.  HPI [name] presents for vertigo, new onset.  Started 4 days ago without previous flying or swimming,  And no recent sinus infections. Describes room spinning at times.  Aggravated by position changes sudden.   Accompanied by nausea. Getting worse,    Has chronic sinus congestion and drainiage and ears feel full  Orthostatics normal by Juliann Pulse.     Outpatient Prescriptions Prior to Visit  Medication Sig Dispense Refill  . cholecalciferol (VITAMIN D) 1000 UNITS tablet Take 1,000 Units by mouth daily.    . citalopram (CELEXA) 20 MG tablet Take 1 tablet (20 mg total) by mouth daily. 90 tablet 1  . metoprolol (LOPRESSOR) 50 MG tablet Take 1 tablet (50 mg total) by mouth 2 (two) times daily. 60 tablet 11  . ranitidine (ZANTAC) 150 MG tablet Take 150 mg by mouth 2 (two) times daily.     No facility-administered medications prior to visit.    Review of Systems;  Patient denies headache, fevers, malaise, unintentional weight loss, skin rash, eye pain, sinus congestion and sinus pain, sore throat, dysphagia,  hemoptysis , cough, dyspnea, wheezing, chest pain, palpitations, orthopnea, edema, abdominal pain, nausea, melena, diarrhea, constipation, flank pain, dysuria, hematuria, urinary  Frequency, nocturia, numbness, tingling, seizures,  Focal weakness, Loss of consciousness,  Tremor, insomnia, depression, anxiety, and suicidal ideation.      Objective:  BP 138/84 mmHg  Pulse 61  Temp(Src) 98.3 F (36.8 C) (Oral)  Resp 12  Ht 5\' 4"  (1.626 m)  Wt 263 lb 8 oz (119.523 kg)  BMI 45.21 kg/m2  SpO2 99%  BP Readings from Last 3 Encounters:  02/27/15 138/84  11/15/14 122/98  09/18/14 134/84    Wt Readings from Last 3 Encounters:  02/27/15 263 lb 8 oz (119.523 kg)  11/15/14 259 lb (117.482 kg)  09/18/14 258 lb 9.6 oz (117.3 kg)    General  appearance: alert, cooperative and appears stated age Ears: injected right TM with effusion  Throat: lips, mucosa, and tongue normal; teeth and gums normal Neck: no adenopathy, no carotid bruit, supple, symmetrical, trachea midline and thyroid not enlarged, symmetric, no tenderness/mass/nodules Back: symmetric, no curvature. ROM normal. No CVA tenderness. Lungs: clear to auscultation bilaterally Heart: regular rate and rhythm, S1, S2 normal, no murmur, click, rub or gallop Abdomen: soft, non-tender; bowel sounds normal; no masses,  no organomegaly Pulses: 2+ and symmetric Skin: Skin color, texture, turgor normal. No rashes or lesions Lymph nodes: Cervical, supraclavicular, and axillary nodes normal. Neuro: CNs 2-12 intact. DTRs 2+/4 in biceps, brachioradialis, patellars and achilles. Muscle strength 5/5 in upper and lower exremities. Fine resting tremor bilaterally both hands cerebellar function normal. Romberg negative.  No pronator drift.   Gait normal.   Lab Results  Component Value Date   HGBA1C 5.0 05/12/2013    Lab Results  Component Value Date   CREATININE 0.71 05/12/2013   CREATININE 0.74 05/12/2012   CREATININE 0.8 03/16/2012    Lab Results  Component Value Date   WBC 5.6 07/14/2013   HGB 13.1 07/14/2013   HCT 38.4 07/14/2013   PLT 163.0 07/14/2013   GLUCOSE 88 05/12/2013   CHOL 219* 06/11/2011   TRIG 91.0 06/11/2011   HDL 51.40 06/11/2011   LDLDIRECT 164.4 06/11/2011   ALT 36* 05/12/2013   AST 28 05/12/2013   NA 139 05/12/2013  K 3.9 05/12/2013   CL 103 05/12/2013   CREATININE 0.71 05/12/2013   BUN 12 05/12/2013   CO2 29 05/12/2013   TSH 1.45 07/14/2013   INR 2.1* 10/31/2008   HGBA1C 5.0 05/12/2013    No results found.  Assessment & Plan:   Problem List Items Addressed This Visit    None      I am having Ms. Collet maintain her ranitidine, cholecalciferol, metoprolol, and citalopram.  No orders of the defined types were placed in this  encounter.    There are no discontinued medications.  Follow-up: No Follow-up on file.   Crecencio Mc, MD

## 2015-02-27 NOTE — Patient Instructions (Signed)
I am treating you for bacterial sinusitis/otitis which is causing your vertigo .   I am prescribing an antibiotic  and a prednisone taper  To manage the infection and the inflammation in your ear/sinuses.   I also advise use of the following OTC meds to help with your other symptoms.   Take generic OTC benadryl 25 mg every 8 hours for the drainage,  Sudafed only  if needed for congestion,   flush your sinuses twice daily with NeilMed sinus rinse  (do over the sink because if you do it right you will spit out globs of mucus)  Meclizine every 6 to 8 hours as needed for vertigo   Please take a probiotic ( Align, Floraque or Culturelle) OR A GENERIC EQUIVALENT for three weeks since you are taking an  antibiotic to prevent a very serious antibiotic associated infection  Called closttidium dificile colitis that can cause diarrhea, multi organ failure, sepsis and death if not managed.

## 2015-02-28 ENCOUNTER — Encounter: Payer: Self-pay | Admitting: Internal Medicine

## 2015-02-28 DIAGNOSIS — J019 Acute sinusitis, unspecified: Secondary | ICD-10-CM | POA: Insufficient documentation

## 2015-02-28 NOTE — Assessment & Plan Note (Signed)
Sinus drainage and congestion with cough productive of purulent sputum and headaches for the last 10 days.  Patient  has used multiple OTD decongestants,  Sinus rinses,  advil and tylenol along with otc cough suppressants without improvement. Antibiotics , sinus rinse and prednisone taper.

## 2015-03-01 IMAGING — CR DG WRIST COMPLETE 3+V*L*
2 series · 2 of 2 positions shown · non-contrast
Comparison: None.

CLINICAL DATA: Patient fell onto wrist 1 day prior ; now with pain
and swelling

EXAM:
LEFT WRIST - COMPLETE 3+ VIEW

[PA]
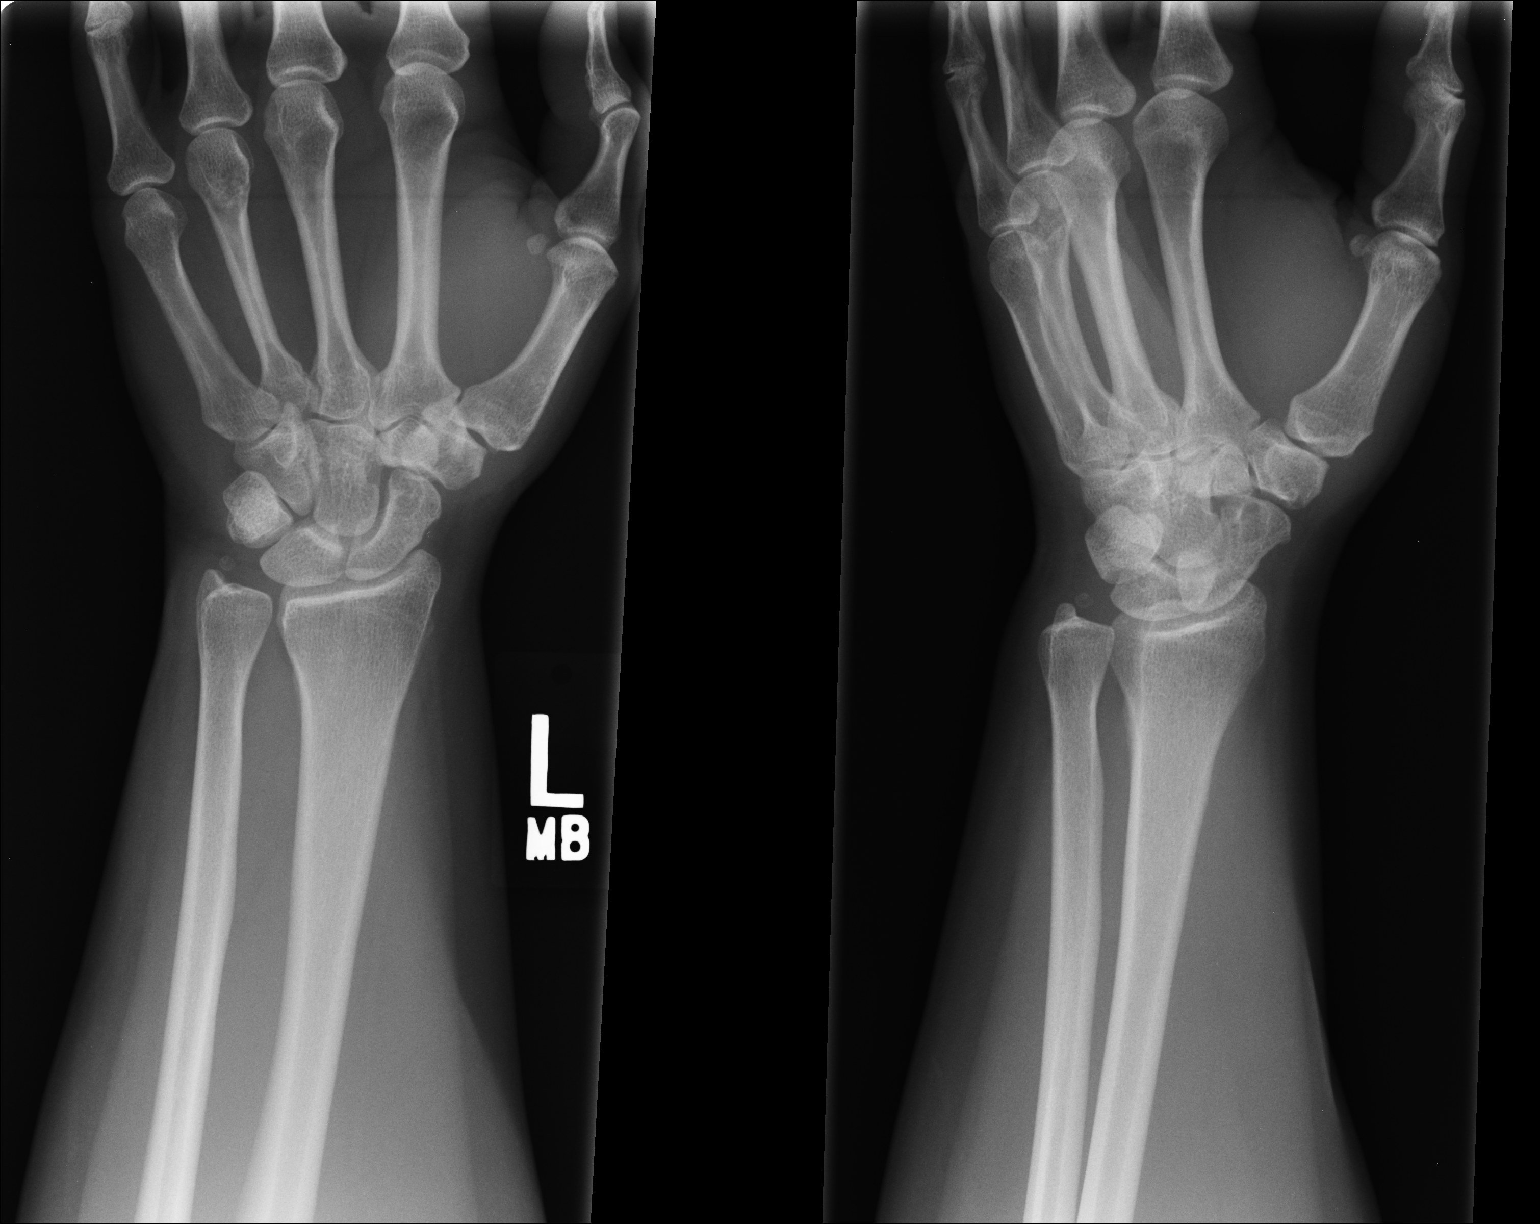

[pa int rot]
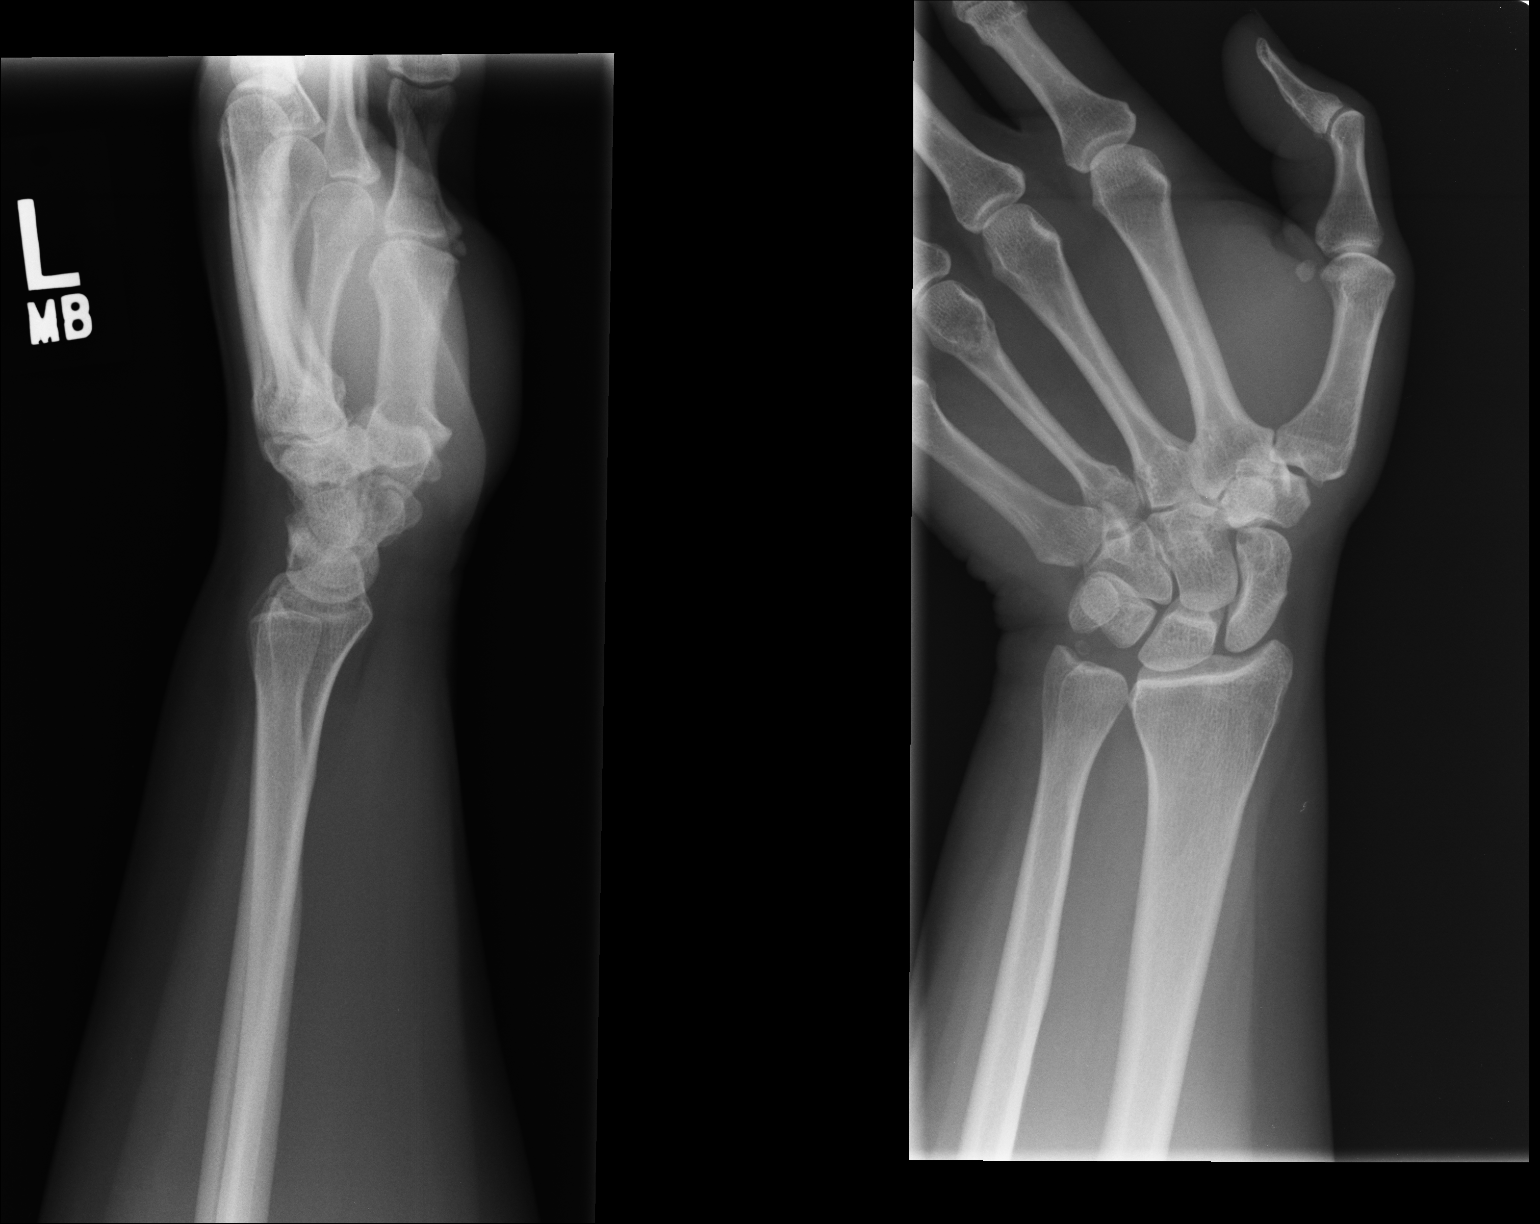

[2 of 2 positions shown; findings below may reference images not displayed]

FINDINGS: Frontal, oblique, lateral, and ulnar deviation scaphoid images were
obtained. There is no acute fracture or dislocation. There is a
benign appearing cystic area in the distal scaphoid. Calcification
adjacent to the ulnar styloid is probably indicative of an accessory
ossicle.
IMPRESSION: Benign cystic area in the distal scaphoid. No fracture or
dislocation. No appreciable arthropathy.

## 2015-04-13 ENCOUNTER — Ambulatory Visit (INDEPENDENT_AMBULATORY_CARE_PROVIDER_SITE_OTHER): Payer: 59 | Admitting: Physician Assistant

## 2015-04-13 VITALS — BP 108/76 | HR 110 | Temp 98.6°F | Resp 20 | Ht 64.5 in | Wt 266.0 lb

## 2015-04-13 DIAGNOSIS — J069 Acute upper respiratory infection, unspecified: Secondary | ICD-10-CM

## 2015-04-13 MED ORDER — IPRATROPIUM BROMIDE 0.03 % NA SOLN: 2.0000 | Freq: Two times a day (BID) | NASAL | Status: DC

## 2015-04-13 MED ORDER — HYDROCOD POLST-CPM POLST ER 10-8 MG/5ML PO SUER: 5.0000 mL | Freq: Every evening | ORAL | Status: DC | PRN

## 2015-04-13 MED ORDER — BENZONATATE 100 MG PO CAPS: 100.0000 mg | ORAL_CAPSULE | Freq: Three times a day (TID) | ORAL | Status: DC | PRN

## 2015-04-13 NOTE — Progress Notes (Signed)
Subjective:     Patient ID: Maria Moore, female   DOB: [birth year], 44 y.o.   MRN: [id number]  HPI Patient present for hacking cough that has gotten progressively worse over the past 4 days. Additionally endorses HA, otalgia, sore throat, congestion, rhinorrhea, and post nasal drip. Denies fever, sinus pressure, SOB, CP, N/V. Has taken multiple OTC medications without relief. Denies seasonal allergies or asthma. No known sick contacts. NKDA.  Review of Systems As noted above.     Objective:   Physical Exam  Constitutional: She is oriented to person, place, and time. She appears well-developed and well-nourished. No distress.  Blood pressure 108/76, pulse 110, temperature 98.6 F (37 C), resp. rate 20, height 5' 4.5" (1.638 m), weight 266 lb (120.657 kg), last menstrual period 03/23/2015, SpO2 98 %.  HENT:  Head: Normocephalic and atraumatic.  Right Ear: Tympanic membrane, external ear and ear canal normal.  Left Ear: Tympanic membrane, external ear and ear canal normal.  Nose: Rhinorrhea (with erythema) present. Right sinus exhibits maxillary sinus tenderness. Right sinus exhibits no frontal sinus tenderness. Left sinus exhibits maxillary sinus tenderness. Left sinus exhibits no frontal sinus tenderness.  Mouth/Throat: Uvula is midline and mucous membranes are normal. Posterior oropharyngeal erythema (mild) present. No oropharyngeal exudate or posterior oropharyngeal edema.  Eyes: Conjunctivae are normal. Pupils are equal, round, and reactive to light. Right eye exhibits no discharge. Left eye exhibits no discharge. No scleral icterus.  Neck: Normal range of motion. Neck supple. No thyromegaly present.  Cardiovascular: Normal rate, regular rhythm and normal heart sounds.  Exam reveals no gallop and no friction rub.   No murmur heard. Pulmonary/Chest: Effort normal and breath sounds normal. No respiratory distress. She has no decreased breath sounds. She has no wheezes. She has no rhonchi.  She has no rales.  Abdominal: Soft. Bowel sounds are normal. She exhibits no distension. There is no tenderness. There is no rebound and no guarding.  Lymphadenopathy:    She has no cervical adenopathy.  Neurological: She is alert and oriented to person, place, and time.  Skin: Skin is warm and dry. No rash noted. She is not diaphoretic. No erythema.      Plan:     1. Acute upper respiratory infection Plenty of fluid and rest. - benzonatate (TESSALON) 100 MG capsule; Take 1-2 capsules (100-200 mg total) by mouth 3 (three) times daily as needed for cough.  Dispense: 40 capsule; Refill: 0 - ipratropium (ATROVENT) 0.03 % nasal spray; Place 2 sprays into both nostrils 2 (two) times daily.  Dispense: 30 mL; Refill: 0 - chlorpheniramine-HYDROcodone (TUSSIONEX PENNKINETIC ER) 10-8 MG/5ML SUER; Take 5 mLs by mouth at bedtime as needed for cough.  Dispense: 75 mL; Refill: 0   Marsela Kuan PA-C  [facility] 04/13/2015 5:09 PM

## 2015-04-13 NOTE — Patient Instructions (Signed)
Upper Respiratory Infection, Adult An upper respiratory infection (URI) is also sometimes known as the common cold. The upper respiratory tract includes the nose, sinuses, throat, trachea, and bronchi. Bronchi are the airways leading to the lungs. Most people improve within 1 week, but symptoms can last up to 2 weeks. A residual cough may last even longer.  CAUSES Many different viruses can infect the tissues lining the upper respiratory tract. The tissues become irritated and inflamed and often become very moist. Mucus production is also common. A cold is contagious. You can easily spread the virus to others by oral contact. This includes kissing, sharing a glass, coughing, or sneezing. Touching your mouth or nose and then touching a surface, which is then touched by another person, can also spread the virus. SYMPTOMS  Symptoms typically develop 1 to 3 days after you come in contact with a cold virus. Symptoms vary from person to person. They may include:  Runny nose.  Sneezing.  Nasal congestion.  Sinus irritation.  Sore throat.  Loss of voice (laryngitis).  Cough.  Fatigue.  Muscle aches.  Loss of appetite.  Headache.  Low-grade fever. DIAGNOSIS  You might diagnose your own cold based on familiar symptoms, since most people get a cold 2 to 3 times a year. Your caregiver can confirm this based on your exam. Most importantly, your caregiver can check that your symptoms are not due to another disease such as strep throat, sinusitis, pneumonia, asthma, or epiglottitis. Blood tests, throat tests, and X-rays are not necessary to diagnose a common cold, but they may sometimes be helpful in excluding other more serious diseases. Your caregiver will decide if any further tests are required. RISKS AND COMPLICATIONS  You may be at risk for a more severe case of the common cold if you smoke cigarettes, have chronic heart disease (such as heart failure) or lung disease (such as asthma), or if  you have a weakened immune system. The very young and very old are also at risk for more serious infections. Bacterial sinusitis, middle ear infections, and bacterial pneumonia can complicate the common cold. The common cold can worsen asthma and chronic obstructive pulmonary disease (COPD). Sometimes, these complications can require emergency medical care and may be life-threatening. PREVENTION  The best way to protect against getting a cold is to practice good hygiene. Avoid oral or hand contact with people with cold symptoms. Wash your hands often if contact occurs. There is no clear evidence that vitamin C, vitamin E, echinacea, or exercise reduces the chance of developing a cold. However, it is always recommended to get plenty of rest and practice good nutrition. TREATMENT  Treatment is directed at relieving symptoms. There is no cure. Antibiotics are not effective, because the infection is caused by a virus, not by bacteria. Treatment may include:  Increased fluid intake. Sports drinks offer valuable electrolytes, sugars, and fluids.  Breathing heated mist or steam (vaporizer or shower).  Eating chicken soup or other clear broths, and maintaining good nutrition.  Getting plenty of rest.  Using gargles or lozenges for comfort.  Controlling fevers with ibuprofen or acetaminophen as directed by your caregiver.  Increasing usage of your inhaler if you have asthma. Zinc gel and zinc lozenges, taken in the first 24 hours of the common cold, can shorten the duration and lessen the severity of symptoms. Pain medicines may help with fever, muscle aches, and throat pain. A variety of non-prescription medicines are available to treat congestion and runny nose. Your caregiver   can make recommendations and may suggest nasal or lung inhalers for other symptoms.  HOME CARE INSTRUCTIONS   Only take over-the-counter or prescription medicines for pain, discomfort, or fever as directed by your  caregiver.  Use a warm mist humidifier or inhale steam from a shower to increase air moisture. This may keep secretions moist and make it easier to breathe.  Drink enough water and fluids to keep your urine clear or pale yellow.  Rest as needed.  Return to work when your temperature has returned to normal or as your caregiver advises. You may need to stay home longer to avoid infecting others. You can also use a face mask and careful hand washing to prevent spread of the virus. SEEK MEDICAL CARE IF:   After the first few days, you feel you are getting worse rather than better.  You need your caregiver's advice about medicines to control symptoms.  You develop chills, worsening shortness of breath, or brown or red sputum. These may be signs of pneumonia.  You develop yellow or brown nasal discharge or pain in the face, especially when you bend forward. These may be signs of sinusitis.  You develop a fever, swollen neck glands, pain with swallowing, or white areas in the back of your throat. These may be signs of strep throat. SEEK IMMEDIATE MEDICAL CARE IF:   You have a fever.  You develop severe or persistent headache, ear pain, sinus pain, or chest pain.  You develop wheezing, a prolonged cough, cough up blood, or have a change in your usual mucus (if you have chronic lung disease).  You develop sore muscles or a stiff neck. Document Released: 01/08/2001 Document Revised: 10/07/2011 Document Reviewed: 10/20/2013 [employer] Patient Information 2015 [employer]. This information is not intended to replace advice given to you by your health care provider. Make sure you discuss any questions you have with your health care provider.  

## 2015-05-17 NOTE — Progress Notes (Signed)
  Medical screening examination/treatment/procedure(s) were performed by non-physician practitioner and as supervising physician I was immediately available for consultation/collaboration.     

## 2015-06-17 ENCOUNTER — Ambulatory Visit (INDEPENDENT_AMBULATORY_CARE_PROVIDER_SITE_OTHER): Payer: 59 | Admitting: Physician Assistant

## 2015-06-17 VITALS — BP 114/76 | HR 100 | Temp 98.3°F | Resp 16 | Ht 64.5 in | Wt 267.0 lb

## 2015-06-17 DIAGNOSIS — L03211 Cellulitis of face: Secondary | ICD-10-CM | POA: Diagnosis not present

## 2015-06-17 MED ORDER — DOXYCYCLINE HYCLATE 100 MG PO CAPS: 100.0000 mg | ORAL_CAPSULE | Freq: Two times a day (BID) | ORAL | Status: AC

## 2015-06-17 NOTE — Progress Notes (Signed)
Subjective:    Patient ID: Maria Moore, female    DOB: July 29, 1971, 44 y.o.   MRN: [id number]  Chief Complaint  Patient presents with  . Facial Swelling    Bump beside eye   HPI Patient presents today for evaluation of left facial bump x 2 weeks.   Started out as a rash over the left side of her nose. Patient rubbed the area quite a bit and developed a bump under her left eye several days later. Rash went away after 2-3 days, but the bump has persisted. It has become particularly large and sore over the last 5 days. Very swollen and tender this morning, prompting her to come in.  She has tried applying Neosporin to the area as well as ice and heat with only minimal relief.  She denies fevers, chills, eye pain, drainage, tearing, or blurry vision. No nasal drainage or congestion.    Has 4 kids and 2 cats at home, suspects she may have picked up an infection on her hands from them and spread it to her face.   Review of Systems  Constitutional: Negative for fever and chills.  HENT: Negative for congestion and rhinorrhea.   Eyes: Negative for pain, discharge, redness and visual disturbance.  Skin: Positive for wound (bump under left eye).   Patient Active Problem List   Diagnosis Date Noted  . Sinusitis, acute 02/28/2015  . Vertigo, labyrinthine 02/27/2015  . Depression with anxiety 08/25/2014  . Fracture of radial head, closed 08/25/2014  . Cholelithiasis without obstruction 07/16/2013  . Pain in joint, ankle and foot 07/16/2013  . Anemia 07/16/2013  . Personal history of colonic polyps 07/16/2013  . Screening for breast cancer 07/16/2013  . S/P endometrial ablation 07/14/2013  . Obesity, Class III, BMI 40-49.9 (morbid obesity) ([place]) 05/23/2012  . Shortness of breath 03/27/2012  . 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 Fatigue 03/16/2012  . VTE (venous thromboembolism)   . History of DVT of lower extremity 06/11/2011  . S/P tubal ligation  06/11/2011  . PALPITATIONS 07/04/2008   Family History  Problem Relation Age of Onset  . COPD Mother   . Cancer Mother   . Early death Mother 76    Died at [facility] 13-May-2011  . Hypertension Father   . Kidney disease Father     renal calculi  . Heart disease Father 27    4 vessel CABG   . Cancer Maternal Grandmother     breast cancer  . Stroke Maternal Grandfather   . Mental illness Paternal Grandmother    Social History   Social History  . Marital Status: Married    Spouse Name: N/A  . Number of Children: N/A  . Years of Education: N/A   Occupational History  . Not on file.   Social History Main Topics  . Smoking status: Never Smoker   . Smokeless tobacco: Never Used  . Alcohol Use: No  . Drug Use: No  . Sexual Activity: Yes    Birth Control/ Protection: None   Other Topics Concern  . 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    Prior to Admission medications   Medication Sig Start Date End Date Taking? Authorizing Provider  cholecalciferol (VITAMIN D) 1000 UNITS tablet Take 1,000 Units by mouth daily.   Yes Historical Provider, MD  citalopram (CELEXA) 20 MG tablet Take 1  tablet (20 mg total) by mouth daily. 01/11/15  Yes Rubbie Battiest, NP  metoprolol (LOPRESSOR) 50 MG tablet Take 1 tablet (50 mg total) by mouth 2 (two) times daily. 11/15/14  Yes Deboraha Sprang, MD   No Known Allergies    Objective:   Physical Exam  Constitutional: She is oriented to person, place, and time. No distress.  Obese female sitting comfortably on exam table.   HENT:  Head: Normocephalic and atraumatic.  Eyes: Conjunctivae and EOM are normal. Right eye exhibits no discharge. Left eye exhibits no discharge. No scleral icterus.  Neck: Neck supple.  Lymphadenopathy:    She has no cervical adenopathy.  Neurological: She is alert and oriented to person, place, and time.  Skin: She is  not diaphoretic.  Small bump noted inferomedially to the left eye with surrounding erythema, edema, and induration. No well-defined pus pocket appreciated. Eye is clear, no drainage or erythema.    BP 114/76 mmHg  Pulse 100  Temp(Src) 98.3 F (36.8 C) (Oral)  Resp 16  Ht 5' 4.5" (1.638 m)  Wt 267 lb (121.11 kg)  BMI 45.14 kg/m2  SpO2 96%  LMP 05/19/2015     Assessment & Plan:  1. Cellulitis of face - Suspect patient developed a skin infection while rubbing the area 2 weeks ago. Given duration of symptoms, do not suspect MRSA. Expect the area to improve within 48 hours, if not, return to clinic for further evaluation.  - doxycycline (VIBRAMYCIN) 100 MG capsule; Take 1 capsule (100 mg total) by mouth 2 (two) times daily.  Dispense: 20 capsule; Refill: 0

## 2015-06-17 NOTE — Progress Notes (Signed)
Patient ID: Maria Moore, female    DOB: [birth year], 44 y.o.   MRN: [id number]  PCP: Crecencio Mc, MD  Subjective:   Chief Complaint  Patient presents with  . Facial Swelling    Bump beside eye    HPI Presents for evaluation of left facial bump x 2 weeks.   Started out as a rash over the left side of her nose. Patient rubbed the area quite a bit and developed a bump under her left eye several days later. Rash went away after 2-3 days, but the bump has persisted. It has become particularly large and sore over the last 5 days. Very swollen and tender this morning, prompting her to come in.   She has tried applying Neosporin to the area as well as ice and heat with only minimal relief.   She denies fevers, chills, eye pain, drainage, tearing, or blurry vision. No nasal drainage or congestion.   Has 4 kids and 2 cats at home, suspects she may have picked up an infection on her hands from them and spread it to her face.    Review of Systems Constitutional: Negative for fever and chills.  HENT: Negative for congestion and rhinorrhea.  Eyes: Negative for pain, discharge, redness and visual disturbance.  Skin: Positive for wound (bump under left eye).      Patient Active Problem List   Diagnosis Date Noted  . Sinusitis, acute 02/28/2015  . Vertigo, labyrinthine 02/27/2015  . Depression with anxiety 08/25/2014  . Fracture of radial head, closed 08/25/2014  . Cholelithiasis without obstruction 07/16/2013  . Pain in joint, ankle and foot 07/16/2013  . Anemia 07/16/2013  . Personal history of colonic polyps 07/16/2013  . Screening for breast cancer 07/16/2013  . S/P endometrial ablation 07/14/2013  . Obesity, Class III, BMI 40-49.9 (morbid obesity) ([place]) 05/23/2012  . Shortness of breath 03/27/2012  . 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 Fatigue 03/16/2012  . VTE (venous thromboembolism)   . History of DVT of lower extremity 06/11/2011    . S/P tubal ligation 06/11/2011  . PALPITATIONS 07/04/2008     Prior to Admission medications   Medication Sig Start Date End Date Taking? Authorizing Provider  cholecalciferol (VITAMIN D) 1000 UNITS tablet Take 1,000 Units by mouth daily.   Yes Historical Provider, MD  citalopram (CELEXA) 20 MG tablet Take 1 tablet (20 mg total) by mouth daily. 01/11/15  Yes Rubbie Battiest, NP  metoprolol (LOPRESSOR) 50 MG tablet Take 1 tablet (50 mg total) by mouth 2 (two) times daily. 11/15/14  Yes Deboraha Sprang, MD  ipratropium (ATROVENT) 0.03 % nasal spray Place 2 sprays into both nostrils 2 (two) times daily. Patient not taking: Reported on 06/17/2015 04/13/15   Tishira R Brewington, PA-C  meclizine (ANTIVERT) 25 MG tablet Take 1 tablet (25 mg total) by mouth 3 (three) times daily as needed for dizziness. Patient not taking: Reported on 04/13/2015 02/27/15   Crecencio Mc, MD  ranitidine (ZANTAC) 150 MG tablet Take 150 mg by mouth 2 (two) times daily.    Historical Provider, MD     No Known Allergies     Objective:  Physical Exam  Constitutional: She is oriented to person, place, and time. She appears well-developed and well-nourished. She is active and cooperative. No distress.  BP 114/76 mmHg  Pulse 100  Temp(Src) 98.3 F (36.8 C) (Oral)  Resp 16  Ht 5' 4.5" (1.638 m)  Wt 267 lb (121.11 kg)  BMI 45.14 kg/m2  SpO2 96%  LMP 05/19/2015   HENT:  Head:    Eyes: Conjunctivae, EOM and lids are normal. Pupils are equal, round, and reactive to light.  Pulmonary/Chest: Effort normal.  Neurological: She is alert and oriented to person, place, and time.  Psychiatric: She has a normal mood and affect. Her speech is normal and behavior is normal.           Assessment & Plan:   1. Cellulitis of face Warm compress. Rest. May be early abscess. Doxycycline. If no significant improvement in 48 hours, will likely need I&D. - doxycycline (VIBRAMYCIN) 100 MG capsule; Take 1 capsule (100 mg total)  by mouth 2 (two) times daily.  Dispense: 20 capsule; Refill: 0   Fara Chute, PA-C Physician Assistant-Certified [facility]

## 2015-06-17 NOTE — Patient Instructions (Signed)
You need to rest, drink plenty of water, and apply a warm compress to the area as much as possible. Lying on the couch, with your head elevated, with the warm compress, for the weekend, will help.

## 2015-06-20 ENCOUNTER — Ambulatory Visit (INDEPENDENT_AMBULATORY_CARE_PROVIDER_SITE_OTHER): Payer: 59 | Admitting: Physician Assistant

## 2015-06-20 VITALS — BP 128/80 | HR 91 | Temp 98.4°F | Resp 17 | Ht 65.5 in | Wt 268.0 lb

## 2015-06-20 DIAGNOSIS — L0201 Cutaneous abscess of face: Secondary | ICD-10-CM | POA: Diagnosis not present

## 2015-06-20 MED ORDER — MUPIROCIN 2 % EX OINT: 1.0000 "application " | TOPICAL_OINTMENT | Freq: Three times a day (TID) | CUTANEOUS | Status: DC

## 2015-06-20 NOTE — Progress Notes (Signed)
   06/20/2015 8:29 PM   DOB: 01-31-1971 / MRN: [id number]  SUBJECTIVE:  Maria Moore is a 44 y.o. female presenting for a bump on her face that has been present for roughly 10 days now.  States that she was seen a few days ago and was placed on Doxy which has helped some but has not resolved her symptoms.  Complains of pain at the sight of the lesion.  No vision changes, fevers, chills. Denies any side effects with doxy.    She has No Known Allergies.   She  has a past medical history of Palpitations; VTE (venous thromboembolism); Depression; Anemia; and Plantar fasciitis.    She  reports that she has never smoked. She has never used smokeless tobacco. She reports that she does not drink alcohol or use illicit drugs. She  reports that she currently engages in sexual activity. She reports using the following method of birth control/protection: None. The patient  has past surgical history that includes D&C (otheR); myringotomy and tubes (otheR); extraction of wisdom teeth; Laparoscopic tubal ligation; and Tubal ligation.  Her family history includes COPD in her mother; Cancer in her maternal grandmother and mother; Early death (age of onset: 53) in her mother; Heart disease (age of onset: 73) in her father; Hypertension in her father; Kidney disease in her father; Mental illness in her paternal grandmother; Stroke in her maternal grandfather.  Review of Systems  Constitutional: Negative for fever.  Eyes: Negative.   Gastrointestinal: Negative for nausea.  Skin: Positive for rash.  Neurological: Negative for dizziness and headaches.    Problem list and medications reviewed and updated by myself where necessary, and exist elsewhere in the encounter.   OBJECTIVE:  BP 128/80 mmHg  Pulse 91  Temp(Src) 98.4 F (36.9 C) (Oral)  Resp 17  Ht 5' 5.5" (1.664 m)  Wt 268 lb (121.564 kg)  BMI 43.90 kg/m2  SpO2 97%  LMP 05/19/2015 CrCl cannot be calculated (Patient has no serum creatinine result  on file.).  Physical Exam   Vitals reviewed and normal.     There is tenderness of the lesion.  No induration or fluctuance.    No results found for this or any previous visit (from the past 48 hour(s)).  ASSESSMENT AND PLAN  [name] was seen today for follow-up.  Diagnoses and all orders for this visit:  Abscess of face: There is no fluctuance and given the location of the lesion I do not feel the benefits of incision and drainage outweigh the risks, which are largely aesthetic.  Advised that she continue the Abx and therapy and will add mupirocin topical in the hopes that it will help resolve the lesion or aid in bringing about fluctuance.     -     mupirocin ointment (BACTROBAN) 2 %; Apply 1 application topically 3 (three) times daily.    The patient was advised to call or return to clinic if she does not see an improvement in symptoms or to seek the care of the closest emergency department if she worsens with the above plan.   Philis Fendt, MHS, PA-C [facility] 06/20/2015 8:29 PM

## 2015-06-25 ENCOUNTER — Other Ambulatory Visit: Payer: Self-pay | Admitting: Nurse Practitioner

## 2015-08-27 ENCOUNTER — Ambulatory Visit (INDEPENDENT_AMBULATORY_CARE_PROVIDER_SITE_OTHER): Payer: Self-pay | Admitting: Family Medicine

## 2015-08-27 VITALS — BP 136/78 | HR 100 | Temp 98.2°F | Resp 20 | Ht 65.0 in | Wt 269.2 lb

## 2015-08-27 DIAGNOSIS — R21 Rash and other nonspecific skin eruption: Secondary | ICD-10-CM

## 2015-08-27 MED ORDER — VALACYCLOVIR HCL 1 G PO TABS: 1000.0000 mg | ORAL_TABLET | Freq: Three times a day (TID) | ORAL | Status: DC

## 2015-08-27 NOTE — Progress Notes (Signed)
Chief Complaint:  Chief Complaint  Patient presents with  . Rash    on face     HPI: Maria Moore is a 45 y.o. female who reports to [facility] today complaining of rash that has not gotten better or gone away The rash is only one side of her face,left side and is painful and she has been on doxycycline and also bactroban ointment since Novemebr. No hx of Lupus or jt pain or immunocompromise, no n/v/abd pain or diarrhea. No unintentional weight loss. NO autoimmune disease that she knows of. Denies HA or vision changes  Past Medical History  Diagnosis Date  . Palpitations   . VTE (venous thromboembolism)     post partum, post procedure, Protein C deficiency ruled out  . Depression   . Anemia   . Plantar fasciitis    Past Surgical History  Procedure Laterality Date  . D&c (other)    . Myringotomy and tubes (other)    . Extraction of wisdom teeth    . Laparoscopic tubal ligation      [place]  . Tubal ligation     Social History   Social History  . Marital Status: Married    Spouse Name: N/A  . Number of Children: N/A  . Years of Education: N/A   Social History Main Topics  . Smoking status: Never Smoker   . Smokeless tobacco: Never Used  . Alcohol Use: No  . Drug Use: No  . Sexual Activity: Yes    Birth Control/ Protection: None   Other Topics Concern  . 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    Family History  Problem Relation Age of Onset  . COPD Mother   . Cancer Mother   . Early death Mother 69    Died at [facility] 2011/05/18  . Hypertension Father   . Kidney disease Father     renal calculi  . Heart disease Father 38    4 vessel CABG   . Cancer Maternal Grandmother     breast cancer  . Stroke Maternal Grandfather   . Mental illness Paternal Grandmother    No Known Allergies Prior to Admission medications   Medication Sig Start Date End Date  Taking? Authorizing Provider  cholecalciferol (VITAMIN D) 1000 UNITS tablet Take 1,000 Units by mouth daily.   Yes Historical Provider, MD  citalopram (CELEXA) 20 MG tablet TAKE 1 TABLET (20 MG TOTAL) BY MOUTH DAILY. 06/26/15  Yes Rubbie Battiest, NP  metoprolol (LOPRESSOR) 50 MG tablet Take 1 tablet (50 mg total) by mouth 2 (two) times daily. 11/15/14  Yes Deboraha Sprang, MD  mupirocin ointment (BACTROBAN) 2 % Apply 1 application topically 3 (three) times daily. 06/20/15  Yes Tereasa Coop, PA-C  valACYclovir (VALTREX) 1000 MG tablet Take 1 tablet (1,000 mg total) by mouth 3 (three) times daily. 08/27/15   Gabryella Murfin P Diar Berkel, DO     ROS: The patient denies fevers, chills, night sweats, unintentional weight loss, chest pain, palpitations, wheezing, dyspnea on exertion, nausea, vomiting, abdominal pain, dysuria, hematuria, melena  All other systems have been reviewed and were otherwise negative with the exception of those mentioned in the HPI and as above.    PHYSICAL EXAM: Filed Vitals:   08/27/15 0847  BP: 136/78  Pulse: 100  Temp: 98.2 F (36.8 C)  Resp: 20   Body  mass index is 44.8 kg/(m^2).   General: Alert, no acute distress HEENT:  Normocephalic, atraumatic, oropharynx patent. EOMI, PERRLA Cardiovascular:  Regular rate and rhythm, no rubs murmurs or gallops.    Respiratory: Clear to auscultation bilaterally.  No wheezes, rales, or rhonchi.  No cyanosis, no use of accessory musculature Abdominal: No organomegaly, abdomen is soft and non-tender, positive bowel sounds. No masses. Skin: + left sided erythematous maculopap facial rash, non excematous, does not appear to be fungal Neurologic: Facial musculature symmetric. Psychiatric: Patient acts appropriately throughout our interaction. Lymphatic: No cervical or submandibular lymphadenopathy Musculoskeletal: Gait intact. No edema, tenderness   LABS: Results for orders placed or performed in visit on 02/27/15  Comprehensive metabolic  panel  Result Value Ref Range   Sodium 139 135 - 145 mEq/L   Potassium 4.2 3.5 - 5.1 mEq/L   Chloride 102 96 - 112 mEq/L   CO2 28 19 - 32 mEq/L   Glucose, Bld 69 (L) 70 - 99 mg/dL   BUN 14 6 - 23 mg/dL   Creatinine, Ser 0.77 0.40 - 1.20 mg/dL   Total Bilirubin 0.5 0.2 - 1.2 mg/dL   Alkaline Phosphatase 65 39 - 117 U/L   AST 23 0 - 37 U/L   ALT 29 0 - 35 U/L   Total Protein 6.8 6.0 - 8.3 g/dL   Albumin 4.1 3.5 - 5.2 g/dL   Calcium 9.4 8.4 - 10.5 mg/dL   GFR 85.75 >60.00 mL/min  Vit D  25 hydroxy (rtn osteoporosis monitoring)  Result Value Ref Range   VITD 25.51 (L) 30.00 - 100.00 ng/mL  C-reactive protein  Result Value Ref Range   CRP 0.9 0.5 - 20.0 mg/dL  Sedimentation rate  Result Value Ref Range   Sed Rate 21 0 - 22 mm/hr  CBC with Differential/Platelet  Result Value Ref Range   WBC 6.3 4.0 - 10.5 K/uL   RBC 4.71 3.87 - 5.11 Mil/uL   Hemoglobin 13.9 12.0 - 15.0 g/dL   HCT 41.4 36.0 - 46.0 %   MCV 87.9 78.0 - 100.0 fl   MCHC 33.6 30.0 - 36.0 g/dL   RDW 14.0 11.5 - 15.5 %   Platelets 194.0 150.0 - 400.0 K/uL   Neutrophils Relative % 63.8 43.0 - 77.0 %   Lymphocytes Relative 27.5 12.0 - 46.0 %   Monocytes Relative 5.8 3.0 - 12.0 %   Eosinophils Relative 2.5 0.0 - 5.0 %   Basophils Relative 0.4 0.0 - 3.0 %   Neutro Abs 4.0 1.4 - 7.7 K/uL   Lymphs Abs 1.7 0.7 - 4.0 K/uL   Monocytes Absolute 0.4 0.1 - 1.0 K/uL   Eosinophils Absolute 0.2 0.0 - 0.7 K/uL   Basophils Absolute 0.0 0.0 - 0.1 K/uL  TSH  Result Value Ref Range   TSH 1.86 0.35 - 4.50 uIU/mL     EKG/XRAY:   Primary read interpreted by Dr. Marin Comment at [facility].   ASSESSMENT/PLAN: Encounter Diagnosis  Name Primary?  . Rash and nonspecific skin eruption Yes   Refer to Dermatology Rx Valtrex Cont with bactroban Fu prn   Gross sideeffects, risk and benefits, and alternatives of medications d/w patient. Patient is aware that all medications have potential sideeffects and we are unable to predict every sideeffect  or drug-drug interaction that may occur.  Cortne Amara DO  08/27/2015 9:37 AM

## 2015-10-23 ENCOUNTER — Other Ambulatory Visit: Payer: Self-pay | Admitting: Nurse Practitioner

## 2015-11-08 ENCOUNTER — Telehealth: Payer: Self-pay | Admitting: Internal Medicine

## 2015-11-08 NOTE — Telephone Encounter (Signed)
Pt called about Period started two weeks ago and still bleeding. Pt is not cramping. No appt avail to sch and no other provider to sch pt with. Pt does not want to see a mail doctor. Let me know where to sch. Call pt @ [phone]. Thank you!

## 2015-11-08 NOTE — Telephone Encounter (Signed)
Spoke with the patient, she has had vaginal drainage for the past 2 weeks.  It is pink in color and it has been the same frequency for the duration.  No other noticeable symptoms.  No headaches, no cramping.  Scheduled to you tomorrow at 10am. thanks

## 2015-11-08 NOTE — Telephone Encounter (Signed)
Attempted to call the patient to get clarity.  Left VM to return my call. thanks

## 2015-11-09 ENCOUNTER — Other Ambulatory Visit (HOSPITAL_COMMUNITY)
Admission: RE | Admit: 2015-11-09 | Discharge: 2015-11-09 | Disposition: A | Discharge status: Home / Self Care | Payer: 59 | Source: Ambulatory Visit | Attending: Internal Medicine | Admitting: Internal Medicine

## 2015-11-09 ENCOUNTER — Ambulatory Visit (INDEPENDENT_AMBULATORY_CARE_PROVIDER_SITE_OTHER): Payer: 59 | Admitting: Internal Medicine

## 2015-11-09 VITALS — BP 106/73 | HR 71 | Temp 98.2°F | Wt 274.5 lb

## 2015-11-09 DIAGNOSIS — R739 Hyperglycemia, unspecified: Secondary | ICD-10-CM

## 2015-11-09 DIAGNOSIS — Z124 Encounter for screening for malignant neoplasm of cervix: Secondary | ICD-10-CM | POA: Diagnosis not present

## 2015-11-09 DIAGNOSIS — N920 Excessive and frequent menstruation with regular cycle: Secondary | ICD-10-CM | POA: Insufficient documentation

## 2015-11-09 DIAGNOSIS — Z1151 Encounter for screening for human papillomavirus (HPV): Secondary | ICD-10-CM | POA: Diagnosis present

## 2015-11-09 DIAGNOSIS — N921 Excessive and frequent menstruation with irregular cycle: Secondary | ICD-10-CM

## 2015-11-09 DIAGNOSIS — Z01419 Encounter for gynecological examination (general) (routine) without abnormal findings: Secondary | ICD-10-CM | POA: Diagnosis present

## 2015-11-09 NOTE — Assessment & Plan Note (Addendum)
Suspect secondary to PCOS  Given her obesity and metabolic syndrome. Marland Kitchen  Perimenopause ruled out with hormone levels

## 2015-11-09 NOTE — Progress Notes (Signed)
Pre visit review using our clinic review tool, if applicable. No additional management support is needed unless otherwise documented below in the visit note. 

## 2015-11-09 NOTE — Patient Instructions (Signed)
I suspect that your prolonged menstrual cycle is due to PCOS  Polycystic Ovarian Syndrome Polycystic ovarian syndrome (PCOS) is a common hormonal disorder among women of reproductive age. Most women with PCOS grow many small cysts on their ovaries. PCOS can cause problems with your periods and make it difficult to get pregnant. It can also cause an increased risk of miscarriage with pregnancy. If left untreated, PCOS can lead to serious health problems, such as diabetes and heart disease. CAUSES The cause of PCOS is not fully understood, but genetics may be a factor. SIGNS AND SYMPTOMS   Infrequent or no menstrual periods.   Inability to get pregnant (infertility) because of not ovulating.   Increased growth of hair on the face, chest, stomach, back, thumbs, thighs, or toes.   Acne, oily skin, or dandruff.   Pelvic pain.   Weight gain or obesity, usually carrying extra weight around the waist.   Type 2 diabetes.   High cholesterol.   High blood pressure.   Female-pattern baldness or thinning hair.   Patches of thickened and dark brown or black skin on the neck, arms, breasts, or thighs.   Tiny excess flaps of skin (skin tags) in the armpits or neck area.   Excessive snoring and having breathing stop at times while asleep (sleep apnea).   Deepening of the voice.   Gestational diabetes when pregnant.  DIAGNOSIS  There is no single test to diagnose PCOS.   Your health care provider will:   Take a medical history.   Perform a pelvic exam.   Have ultrasonography done.   Check your female and female hormone levels.   Measure glucose or sugar levels in the blood.   Do other blood tests.   If you are producing too many female hormones, your health care provider will make sure it is from PCOS. At the physical exam, your health care provider will want to evaluate the areas of increased hair growth. Try to allow natural hair growth for a few days before  the visit.   During a pelvic exam, the ovaries may be enlarged or swollen because of the increased number of small cysts. This can be seen more easily by using vaginal ultrasonography or screening to examine the ovaries and lining of the uterus (endometrium) for cysts. The uterine lining may become thicker if you have not been having a regular period.  TREATMENT  Because there is no cure for PCOS, it needs to be managed to prevent problems. Treatments are based on your symptoms. Treatment is also based on whether you want to have a baby or whether you need contraception.  Treatment may include:   Progesterone hormone to start a menstrual period.   Birth control pills to make you have regular menstrual periods.   Medicines to make you ovulate, if you want to get pregnant.   Medicines to control your insulin.   Medicine to control your blood pressure.   Medicine and diet to control your high cholesterol and triglycerides in your blood.  Medicine to reduce excessive hair growth.  Surgery, making small holes in the ovary, to decrease the amount of female hormone production. This is done through a long, lighted tube (laparoscope) placed into the pelvis through a tiny incision in the lower abdomen.  HOME CARE INSTRUCTIONS  Only take over-the-counter or prescription medicine as directed by your health care provider.  Pay attention to the foods you eat and your activity levels. This can help reduce the effects of  PCOS.  Keep your weight under control.  Eat foods that are low in carbohydrate and high in fiber.  Exercise regularly. SEEK MEDICAL CARE IF:  Your symptoms do not get better with medicine.  You have new symptoms.   This information is not intended to replace advice given to you by your health care provider. Make sure you discuss any questions you have with your health care provider.   Document Released: 11/08/2004 Document Revised: 05/05/2013 Document Reviewed:  12/31/2012 Elsevier Interactive Patient Education [employer].

## 2015-11-09 NOTE — Progress Notes (Signed)
Subjective:  Patient ID: Maria Moore, female    DOB: 1970-08-20  Age: 45 y.o. MRN: [id number]  CC: The primary encounter diagnosis was Menorrhagia with irregular cycle. Diagnoses of Hyperglycemia and Cervical cancer screening were also pertinent to this visit.  HPI Maria Moore presents for  "vaginal discharge" and menstrual irregularities  Feb period was late by 5 days, then has been nothing but pink discharge daily  since late February .  Doesn't use tampons.  Is sexually active and has not used barrier protection because she is married and has had a tubal ligation  She has had a prior endometerial ablation .  None of the usual pre period cramping  Some breast engorgement and tenderness  No nausea.    Outpatient Prescriptions Prior to Visit  Medication Sig Dispense Refill  . cholecalciferol (VITAMIN D) 1000 UNITS tablet Take 1,000 Units by mouth daily.    . citalopram (CELEXA) 20 MG tablet TAKE 1 TABLET (20 MG TOTAL) BY MOUTH DAILY. 90 tablet 0  . metoprolol (LOPRESSOR) 50 MG tablet Take 1 tablet (50 mg total) by mouth 2 (two) times daily. 60 tablet 11  . mupirocin ointment (BACTROBAN) 2 % Apply 1 application topically 3 (three) times daily. 30 g 0  . valACYclovir (VALTREX) 1000 MG tablet Take 1 tablet (1,000 mg total) by mouth 3 (three) times daily. 21 tablet 0   No facility-administered medications prior to visit.    Review of Systems;  Patient denies headache, fevers, malaise, unintentional weight loss, skin rash, eye pain, sinus congestion and sinus pain, sore throat, dysphagia,  hemoptysis , cough, dyspnea, wheezing, chest pain, palpitations, orthopnea, edema, abdominal pain, nausea, melena, diarrhea, constipation, flank pain, dysuria, hematuria, urinary  Frequency, nocturia, numbness, tingling, seizures,  Focal weakness, Loss of consciousness,  Tremor, insomnia, depression, anxiety, and suicidal ideation.      Objective:  BP 106/73 mmHg  Pulse 71  Temp(Src) 98.2 F  (36.8 C) (Oral)  Wt 274 lb 8 oz (124.512 kg)  SpO2 97%  LMP 10/21/2015  BP Readings from Last 3 Encounters:  11/09/15 106/73  08/27/15 136/78  06/20/15 128/80    Wt Readings from Last 3 Encounters:  11/09/15 274 lb 8 oz (124.512 kg)  08/27/15 269 lb 3.2 oz (122.108 kg)  06/20/15 268 lb (121.564 kg)    General Appearance:    Alert, cooperative, no distress, appears stated age  Head:    Normocephalic, without obvious abnormality, atraumatic  Eyes:    PERRL, conjunctiva/corneas clear, EOM's intact, fundi    benign, both eyes  Ears:    Normal TM's and external ear canals, both ears  Nose:   Nares normal, septum midline, mucosa normal, no drainage    or sinus tenderness  Throat:   Lips, mucosa, and tongue normal; teeth and gums normal  Neck:   Supple, symmetrical, trachea midline, no adenopathy;    thyroid:  no enlargement/tenderness/nodules; no carotid   bruit or JVD  Back:     Symmetric, no curvature, ROM normal, no CVA tenderness  Lungs:     Clear to auscultation bilaterally, respirations unlabored  Chest Wall:    No tenderness or deformity   Heart:    Regular rate and rhythm, S1 and S2 normal, no murmur, rub   or gallop  Breast Exam:    No tenderness, masses, or nipple abnormality  Abdomen:     Soft, non-tender, bowel sounds active all four quadrants,    no masses, no organomegaly  Genitalia:  Pelvic: very long vaginal vault, currently mensturatiing, cervix not visible despite multiple attempts , external genitalia normal, no adnexal masses or tenderness, no cervical motion tenderness, rectovaginal septum normal, uterus normal size, shape, and consistency and vagina normal without purulent discharge                     Lab Results  Component Value Date   HGBA1C 5.5 11/09/2015   HGBA1C 5.0 05/12/2013    Lab Results  Component Value Date   CREATININE 0.72 11/09/2015   CREATININE 0.77 02/27/2015   CREATININE 0.71 05/12/2013    Lab Results  Component Value  Date   WBC 6.3 02/27/2015   HGB 13.9 02/27/2015   HCT 41.4 02/27/2015   PLT 194.0 02/27/2015   GLUCOSE 81 11/09/2015   CHOL 219* 06/11/2011   TRIG 91.0 06/11/2011   HDL 51.40 06/11/2011   LDLDIRECT 164.4 06/11/2011   ALT 24 11/09/2015   AST 21 11/09/2015   NA 139 11/09/2015   K 4.5 11/09/2015   CL 101 11/09/2015   CREATININE 0.72 11/09/2015   BUN 12 11/09/2015   CO2 21 11/09/2015   TSH 1.280 11/09/2015   INR 2.1* 10/31/2008   HGBA1C 5.5 11/09/2015     Assessment & Plan:   Problem List Items Addressed This Visit    Menorrhagia - Primary    Suspect secondary to PCOS .  Perimenopause ruled out with hormone levels      Relevant Orders   FSH (Completed)   LH (Completed)   Comp Met (CMET) (Completed)   TSH (Completed)    Other Visit Diagnoses    Hyperglycemia        Relevant Orders    HgB A1c (Completed)    Cervical cancer screening        Relevant Orders    Cytology - PAP (Completed)       I have discontinued Ms. Brier mupirocin ointment and valACYclovir. I am also having her maintain her cholecalciferol, metoprolol, and citalopram.  No orders of the defined types were placed in this encounter.    Medications Discontinued During This Encounter  Medication Reason  . mupirocin ointment (BACTROBAN) 2 % Completed Course  . valACYclovir (VALTREX) 1000 MG tablet Completed Course    Follow-up: Return in about 2 months (around 01/09/2016), or annual exam .   Crecencio Mc, MD

## 2015-11-10 LAB — COMPREHENSIVE METABOLIC PANEL
ALT: 24 IU/L (ref 0–32)
AST: 21 IU/L (ref 0–40)
Albumin/Globulin Ratio: 1.5 (ref 1.2–2.2)
Albumin: 3.8 g/dL (ref 3.5–5.5)
Alkaline Phosphatase: 66 IU/L (ref 39–117)
BUN/Creatinine Ratio: 17 (ref 9–23)
BUN: 12 mg/dL (ref 6–24)
Bilirubin Total: 0.3 mg/dL (ref 0.0–1.2)
CO2: 21 mmol/L (ref 18–29)
Calcium: 9.3 mg/dL (ref 8.7–10.2)
Chloride: 101 mmol/L (ref 96–106)
Creatinine, Ser: 0.72 mg/dL (ref 0.57–1.00)
GFR calc Af Amer: 116 mL/min/{1.73_m2} (ref >59)
GFR calc non Af Amer: 101 mL/min/{1.73_m2} (ref >59)
Globulin, Total: 2.6 g/dL (ref 1.5–4.5)
Glucose: 81 mg/dL (ref 65–99)
Potassium: 4.5 mmol/L (ref 3.5–5.2)
Sodium: 139 mmol/L (ref 134–144)
Total Protein: 6.4 g/dL (ref 6.0–8.5)

## 2015-11-10 LAB — HEMOGLOBIN A1C
Est. average glucose Bld gHb Est-mCnc: 111 mg/dL
Hgb A1c MFr Bld: 5.5 % (ref 4.8–5.6)

## 2015-11-10 LAB — LUTEINIZING HORMONE: LH: 2.9 m[IU]/mL

## 2015-11-10 LAB — FOLLICLE STIMULATING HORMONE: FSH: 4.8 m[IU]/mL

## 2015-11-10 LAB — CYTOLOGY - PAP

## 2015-11-10 LAB — TSH: TSH: 1.28 u[IU]/mL (ref 0.450–4.500)

## 2015-11-12 ENCOUNTER — Encounter: Payer: Self-pay | Admitting: Internal Medicine

## 2015-11-16 ENCOUNTER — Encounter (HOSPITAL_COMMUNITY): Payer: Self-pay | Admitting: Emergency Medicine

## 2015-11-16 ENCOUNTER — Telehealth: Payer: Self-pay | Admitting: Internal Medicine

## 2015-11-16 ENCOUNTER — Emergency Department (HOSPITAL_COMMUNITY): Payer: 59

## 2015-11-16 ENCOUNTER — Emergency Department (HOSPITAL_COMMUNITY)
Admission: EM | Admit: 2015-11-16 | Discharge: 2015-11-16 | Disposition: A | Discharge status: Home / Self Care | Payer: 59 | Attending: Emergency Medicine | Admitting: Emergency Medicine

## 2015-11-16 DIAGNOSIS — Z3202 Encounter for pregnancy test, result negative: Secondary | ICD-10-CM | POA: Diagnosis not present

## 2015-11-16 DIAGNOSIS — N938 Other specified abnormal uterine and vaginal bleeding: Secondary | ICD-10-CM | POA: Insufficient documentation

## 2015-11-16 DIAGNOSIS — R51 Headache: Secondary | ICD-10-CM | POA: Diagnosis not present

## 2015-11-16 DIAGNOSIS — F329 Major depressive disorder, single episode, unspecified: Secondary | ICD-10-CM | POA: Diagnosis not present

## 2015-11-16 DIAGNOSIS — Z86718 Personal history of other venous thrombosis and embolism: Secondary | ICD-10-CM | POA: Insufficient documentation

## 2015-11-16 DIAGNOSIS — Z862 Personal history of diseases of the blood and blood-forming organs and certain disorders involving the immune mechanism: Secondary | ICD-10-CM | POA: Insufficient documentation

## 2015-11-16 DIAGNOSIS — Z8739 Personal history of other diseases of the musculoskeletal system and connective tissue: Secondary | ICD-10-CM | POA: Diagnosis not present

## 2015-11-16 DIAGNOSIS — Z79899 Other long term (current) drug therapy: Secondary | ICD-10-CM | POA: Insufficient documentation

## 2015-11-16 DIAGNOSIS — R42 Dizziness and giddiness: Secondary | ICD-10-CM | POA: Insufficient documentation

## 2015-11-16 DIAGNOSIS — N939 Abnormal uterine and vaginal bleeding, unspecified: Secondary | ICD-10-CM

## 2015-11-16 LAB — BASIC METABOLIC PANEL
Anion gap: 11 (ref 5–15)
BUN: 10 mg/dL (ref 6–20)
CO2: 24 mmol/L (ref 22–32)
Calcium: 9.2 mg/dL (ref 8.9–10.3)
Chloride: 107 mmol/L (ref 101–111)
Creatinine, Ser: 0.81 mg/dL (ref 0.44–1.00)
GFR calc Af Amer: >60 mL/min (ref >60)
GFR calc non Af Amer: >60 mL/min (ref >60)
Glucose, Bld: 92 mg/dL (ref 65–99)
Potassium: 4.4 mmol/L (ref 3.5–5.1)
Sodium: 142 mmol/L (ref 135–145)

## 2015-11-16 LAB — CBC WITH DIFFERENTIAL/PLATELET
Basophils Absolute: 0 10*3/uL (ref 0.0–0.1)
Basophils Relative: 0 %
Eosinophils Absolute: 0.2 10*3/uL (ref 0.0–0.7)
Eosinophils Relative: 3 %
HCT: 37.9 % (ref 36.0–46.0)
Hemoglobin: 12.2 g/dL (ref 12.0–15.0)
Lymphocytes Relative: 28 %
Lymphs Abs: 1.7 10*3/uL (ref 0.7–4.0)
MCH: 28.5 pg (ref 26.0–34.0)
MCHC: 32.2 g/dL (ref 30.0–36.0)
MCV: 88.6 fL (ref 78.0–100.0)
Monocytes Absolute: 0.5 10*3/uL (ref 0.1–1.0)
Monocytes Relative: 8 %
Neutro Abs: 3.7 10*3/uL (ref 1.7–7.7)
Neutrophils Relative %: 61 %
Platelets: 176 10*3/uL (ref 150–400)
RBC: 4.28 MIL/uL (ref 3.87–5.11)
RDW: 13.7 % (ref 11.5–15.5)
WBC: 6.1 10*3/uL (ref 4.0–10.5)

## 2015-11-16 LAB — I-STAT BETA HCG BLOOD, ED (MC, WL, AP ONLY): I-stat hCG, quantitative: <5 m[IU]/mL (ref <5)

## 2015-11-16 NOTE — ED Provider Notes (Signed)
CSN: [id number]     Arrival date & time 11/16/15  0825 History   First MD Initiated Contact with Patient 11/16/15 [phone]     Chief Complaint  Patient presents with  . 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 The patient is not on any anticoagulation. She reports that since March 24 she has had vaginal bleeding that at times has been heavy. She reports recently and has gotten more heavy and that every time she stands up she feels a gush of blood. She saw her primary care physician for this a few days ago. She was told then that everything looked fine and was referred to OB/GYN for further evaluation. This initially could get an appointment with OB was for May 5. She reports that over the last couple of days she has started to feel lightheaded and had a mild headache when she stands up. She also reports that she started to develop cramping pain in her lower abdomen/pelvic region. Denies fevers or chills.  She reports a history of similar issue that was treated with a D&C. She cannot be on oral hormonal contraceptives secondary to her history of blood clots.  Patient is a 45 y.o. female presenting with vaginal bleeding.  Vaginal Bleeding Associated symptoms: fatigue   Associated symptoms: no abdominal pain, no back pain, no dizziness, no dysuria, no fever and no nausea     Past Medical History  Diagnosis Date  . Palpitations   . VTE (venous thromboembolism)     post partum, post procedure, Protein C deficiency ruled out  . Depression   . Anemia   . Plantar fasciitis    Past Surgical History  Procedure Laterality Date  . D&c (other)    . Myringotomy and tubes (other)    . Extraction of wisdom teeth    . Laparoscopic tubal ligation      [place]  . Tubal ligation     Family History  Problem Relation Age of Onset  . COPD Mother    . Cancer Mother   . Early death Mother 53    Died at [facility] 05-06-11  . Hypertension Father   . Kidney disease Father     renal calculi  . Heart disease Father 8    4 vessel CABG   . Cancer Maternal Grandmother     breast cancer  . Stroke Maternal Grandfather   . Mental illness Paternal Grandmother    Social History  Substance Use Topics  . Smoking status: Never Smoker   . Smokeless tobacco: Never Used  . Alcohol Use: No   OB History    No data available     Review of Systems  Constitutional: Positive for fatigue. Negative for fever and chills.  HENT: Negative for congestion, postnasal drip, rhinorrhea and sinus pressure.   Eyes: Negative for pain, redness and visual disturbance.  Cardiovascular: Negative for chest pain, palpitations and leg swelling.  Gastrointestinal: Negative for nausea, vomiting, abdominal pain and diarrhea.  Genitourinary: Positive for vaginal bleeding. Negative for dysuria, urgency and frequency.  Musculoskeletal: Negative for myalgias and back pain.  Skin: Negative for rash and wound.  Neurological: Positive for light-headedness and headaches. Negative for dizziness and weakness.  Hematological: Does not bruise/bleed easily.      Allergies  Review of patient's allergies indicates no known allergies.  Home Medications   Prior to Admission medications   Medication Sig  Start Date End Date Taking? Authorizing Provider  cholecalciferol (VITAMIN D) 1000 UNITS tablet Take 1,000 Units by mouth every other day.    Yes Historical Provider, MD  citalopram (CELEXA) 20 MG tablet TAKE 1 TABLET (20 MG TOTAL) BY MOUTH DAILY. 10/24/15  Yes Rubbie Battiest, NP  metoprolol (LOPRESSOR) 50 MG tablet Take 1 tablet (50 mg total) by mouth 2 (two) times daily. 11/15/14  Yes Deboraha Sprang, MD   BP 143/92 mmHg  Pulse 82  Temp(Src) 98.2 F (36.8 C) (Oral)  Resp 17  Ht 5\' 5"  (1.651 m)  Wt 270 lb (122.471 kg)  BMI 44.93 kg/m2  SpO2 99%  LMP  10/21/2015 Physical Exam  Constitutional: She is oriented to person, place, and time. She appears well-developed and well-nourished. No distress.  HENT:  Head: Normocephalic and atraumatic.  Right Ear: External ear normal.  Left Ear: External ear normal.  Nose: Nose normal.  Mouth/Throat: Oropharynx is clear and moist. No oropharyngeal exudate.  Eyes: EOM are normal. Pupils are equal, round, and reactive to light.  Neck: Normal range of motion. Neck supple.  Cardiovascular: Normal rate, regular rhythm, normal heart sounds and intact distal pulses.   No murmur heard. Pulmonary/Chest: Effort normal. No respiratory distress. She has no wheezes. She has no rales.  Abdominal: Soft. She exhibits no distension. There is no tenderness.  Genitourinary: Uterus is tender (mild). Uterus is not deviated and not enlarged. Cervix exhibits no motion tenderness, no discharge and no friability. Right adnexum displays no mass, no tenderness and no fullness. Left adnexum displays no mass, no tenderness and no fullness. There is bleeding (patient with.. Cleared with 6 Fox swabs.) in the vagina.  Musculoskeletal: Normal range of motion. She exhibits no edema or tenderness.  Neurological: She is alert and oriented to person, place, and time.  Skin: Skin is warm and dry. No rash noted. She is not diaphoretic.  Vitals reviewed.   ED Course  Procedures (including critical care time) Labs Review Labs Reviewed  CBC WITH DIFFERENTIAL/PLATELET  BASIC METABOLIC PANEL  I-STAT BETA HCG BLOOD, ED ([facility], [facility], [facility] ONLY)    Imaging Review US Transvaginal Non-ob  11/16/2015  CLINICAL DATA:  Vaginal bleeding for 1 month. This has become heavier. EXAM: TRANSABDOMINAL AND TRANSVAGINAL ULTRASOUND OF PELVIS TECHNIQUE: Both transabdominal and transvaginal ultrasound examinations of the pelvis were performed. Transabdominal technique was performed for global imaging of the pelvis including uterus, ovaries, adnexal regions, and  pelvic cul-de-sac. It was necessary to proceed with endovaginal exam following the transabdominal exam to visualize the endometrium and ovaries to better advantage. COMPARISON:  None FINDINGS: Uterus Measurements: 11.5 x 6.5 x 7.0 cm. No mass or fibroid. Several nabothian cysts noted arising from the upper cervix, incidental. Endometrium Thickness: 2.3 cm. Heterogeneous echogenicity with several, small, ill-defined hypo to anechoic areas. No discrete mass. Right ovary Measurements: 2.8 x 2.1 x 3.0 cm. Normal appearance/no adnexal mass. Left ovary Measurements: 4.8 x 2.3 x 2.6 cm. Mildly complicated 2.1 cm presumed follicular cyst. No adnexal masses. Other findings Trace pelvic free fluid. IMPRESSION: 1. Endometrium is thickened and heterogeneous, abnormal for this patient's age. If bleeding remains unresponsive to hormonal or medical therapy, focal lesion work-up with sonohysterogram should be considered. Endometrial biopsy should also be considered in pre-menopausal patients at high risk for endometrial carcinoma. (Ref: Radiological Reasoning: Algorithmic Workup of Abnormal Vaginal Bleeding with Endovaginal Sonography and Sonohysterography. AJR 2008; [id number]) 2. No other significant finding. Electronically Signed   By: Dedra Skeens.D.  On: 11/16/2015 11:27   US Pelvis Complete  11/16/2015  CLINICAL DATA:  Vaginal bleeding for 1 month. This has become heavier. EXAM: TRANSABDOMINAL AND TRANSVAGINAL ULTRASOUND OF PELVIS TECHNIQUE: Both transabdominal and transvaginal ultrasound examinations of the pelvis were performed. Transabdominal technique was performed for global imaging of the pelvis including uterus, ovaries, adnexal regions, and pelvic cul-de-sac. It was necessary to proceed with endovaginal exam following the transabdominal exam to visualize the endometrium and ovaries to better advantage. COMPARISON:  None FINDINGS: Uterus Measurements: 11.5 x 6.5 x 7.0 cm. No mass or fibroid. Several nabothian  cysts noted arising from the upper cervix, incidental. Endometrium Thickness: 2.3 cm. Heterogeneous echogenicity with several, small, ill-defined hypo to anechoic areas. No discrete mass. Right ovary Measurements: 2.8 x 2.1 x 3.0 cm. Normal appearance/no adnexal mass. Left ovary Measurements: 4.8 x 2.3 x 2.6 cm. Mildly complicated 2.1 cm presumed follicular cyst. No adnexal masses. Other findings Trace pelvic free fluid. IMPRESSION: 1. Endometrium is thickened and heterogeneous, abnormal for this patient's age. If bleeding remains unresponsive to hormonal or medical therapy, focal lesion work-up with sonohysterogram should be considered. Endometrial biopsy should also be considered in pre-menopausal patients at high risk for endometrial carcinoma. (Ref: Radiological Reasoning: Algorithmic Workup of Abnormal Vaginal Bleeding with Endovaginal Sonography and Sonohysterography. AJR 2008; [id number]) 2. No other significant finding. Electronically Signed   By: Lajean Manes M.D.   On: 11/16/2015 11:27   I have personally reviewed and evaluated these images and lab results as part of my medical decision-making.   EKG Interpretation None      MDM  Patient was seen and evaluated in stable condition. Vitals unremarkable. Laboratory studies unremarkable with a stable hemoglobin. Ultrasound showed thickened endometrium with heterogeneous appearance. Patient and husband were updated on results and plan of care. Patient to follow up with OB/GYN outpatient and try to get a sooner follow-up appointment. She was given strict return precautions. She and her husband expressed understanding and agreement with plan of care. Final diagnoses:  Vaginal bleeding    1. Dysfunctional uterine bleeding    Harvel Quale, MD 11/16/15 [phone]

## 2015-11-16 NOTE — Telephone Encounter (Signed)
Your referral is in process as requested. Our referral coordinator will call you when the appointment has been made.  

## 2015-11-16 NOTE — Telephone Encounter (Signed)
Patient went to the ED for vaginal bleeding, needs urgent referral to GYN, please advise and order.  I see at her last OV you had a discussion about this in a mychart message. Thanks

## 2015-11-16 NOTE — Discharge Instructions (Signed)
°  You were seen and evaluated today for your vaginal bleeding. At this time your blood counts are normal. Your vital signs were also normal. You need to follow-up outpatient with an OB/GYN. If your symptoms suddenly worsen we are open 24 hours a day and couldn't reevaluate you hear or you could proceed to the [facility] to see if they could see you emergently. Try to rest. Take an iron supplement.  Dysfunctional Uterine Bleeding Dysfunctional uterine bleeding is abnormal bleeding from the uterus. Dysfunctional uterine bleeding includes:  A period that comes earlier or later than usual.  A period that is lighter, heavier, or has blood clots.  Bleeding between periods.  Skipping one or more periods.  Bleeding after sexual intercourse.  Bleeding after menopause. HOME CARE INSTRUCTIONS  Pay attention to any changes in your symptoms. Follow these instructions to help with your condition: Eating  Eat well-balanced meals. Include foods that are high in iron, such as liver, meat, shellfish, green leafy vegetables, and eggs.  If you become constipated:  Drink plenty of water.  Eat fruits and vegetables that are high in water and fiber, such as spinach, carrots, raspberries, apples, and mango. Medicines  Take over-the-counter and prescription medicines only as told by your health care provider.  Do not change medicines without talking with your health care provider.  Aspirin or medicines that contain aspirin may make the bleeding worse. Do not take those medicines:  During the week before your period.  During your period.  If you were prescribed iron pills, take them as told by your health care provider. Iron pills help to replace iron that your body loses because of this condition. Activity  If you need to change your sanitary pad or tampon more than one time every 2 hours:  Lie in bed with your feet raised (elevated).  Place a cold pack on your lower abdomen.  Rest as  much as possible until the bleeding stops or slows down.  Do not try to lose weight until the bleeding has stopped and your blood iron level is back to normal. Other Instructions  For two months, write down:  When your period starts.  When your period ends.  When any abnormal bleeding occurs.  What problems you notice.  Keep all follow up visits as told by your health care provider. This is important. SEEK MEDICAL CARE IF:  You get light-headed or weak.  You have nausea and vomiting.  You cannot eat or drink without vomiting.  You feel dizzy or have diarrhea while you are taking medicines.  You are taking birth control pills or hormones, and you want to change them or stop taking them. SEEK IMMEDIATE MEDICAL CARE IF:  You develop a fever or chills.  You need to change your sanitary pad or tampon more than one time per hour.  Your bleeding becomes heavier, or your flow contains clots more often.  You develop pain in your abdomen.  You lose consciousness.  You develop a rash.   This information is not intended to replace advice given to you by your health care provider. Make sure you discuss any questions you have with your health care provider.   Document Released: 07/12/2000 Document Revised: 04/05/2015 Document Reviewed: 10/10/2014 Elsevier Interactive Patient Education [employer].

## 2015-11-16 NOTE — ED Notes (Signed)
Pt to ER by private vehicle with complaint of heavy vaginal bleeding that began 03/24. Pt reports regular periods up until March 24. Pt reports heavy bleeding, when she stands she reports "it just gushes out." saw her PCP Thursday and was referred to an OB who cannot see her until 12/01/2015, pt states she has been feeling very light headed lately and having frequent headaches. Concerned she is losing too much blood. Pt is alert and oriented x4.

## 2015-11-16 NOTE — Telephone Encounter (Signed)
The patient called she was seen in the ER for vaginal bleeding needing a referral order ASAP to OB-GYN.

## 2015-11-17 ENCOUNTER — Encounter: Payer: Self-pay | Admitting: Obstetrics and Gynecology

## 2015-11-17 ENCOUNTER — Ambulatory Visit (INDEPENDENT_AMBULATORY_CARE_PROVIDER_SITE_OTHER): Payer: 59 | Admitting: Obstetrics and Gynecology

## 2015-11-17 VITALS — BP 133/75 | HR 73 | Ht 65.0 in | Wt 271.8 lb

## 2015-11-17 DIAGNOSIS — Z09 Encounter for follow-up examination after completed treatment for conditions other than malignant neoplasm: Secondary | ICD-10-CM | POA: Diagnosis not present

## 2015-11-17 DIAGNOSIS — N938 Other specified abnormal uterine and vaginal bleeding: Secondary | ICD-10-CM | POA: Diagnosis not present

## 2015-11-17 MED ORDER — MEDROXYPROGESTERONE ACETATE 10 MG PO TABS: 10.0000 mg | ORAL_TABLET | Freq: Every day | ORAL | Status: DC

## 2015-11-17 NOTE — Progress Notes (Signed)
Subjective:     Patient ID: Maria Moore, female   DOB: [birth year], 45 y.o.   MRN: [id number]  HPI   Review of Systems     Objective:   Physical Exam A&O x4  well groomed obese female in no distress Blood pressure 133/75, pulse 73, height 5\' 5"  (1.651 m), weight 271 lb 12.8 oz (123.288 kg), last menstrual period 10/21/2015. Reviewed the following u/s with patient:    US Pelvis Complete   Status: Final result       PACS Images     Show images for US Pelvis Complete     Study Result     CLINICAL DATA: Vaginal bleeding for 1 month. This has become heavier.  EXAM: TRANSABDOMINAL AND TRANSVAGINAL ULTRASOUND OF PELVIS  TECHNIQUE: Both transabdominal and transvaginal ultrasound examinations of the pelvis were performed. Transabdominal technique was performed for global imaging of the pelvis including uterus, ovaries, adnexal regions, and pelvic cul-de-sac. It was necessary to proceed with endovaginal exam following the transabdominal exam to visualize the endometrium and ovaries to better advantage.  COMPARISON: None  FINDINGS: Uterus  Measurements: 11.5 x 6.5 x 7.0 cm. No mass or fibroid. Several nabothian cysts noted arising from the upper cervix, incidental.  Endometrium  Thickness: 2.3 cm. Heterogeneous echogenicity with several, small, ill-defined hypo to anechoic areas. No discrete mass.  Right ovary  Measurements: 2.8 x 2.1 x 3.0 cm. Normal appearance/no adnexal mass.  Left ovary  Measurements: 4.8 x 2.3 x 2.6 cm. Mildly complicated 2.1 cm presumed follicular cyst. No adnexal masses.  Other findings  Trace pelvic free fluid.  IMPRESSION: 1. Endometrium is thickened and heterogeneous, abnormal for this patient's age. If bleeding remains unresponsive to hormonal or medical therapy, focal lesion work-up with sonohysterogram should be considered. Endometrial biopsy should also be considered in pre-menopausal patients at high  risk for endometrial carcinoma. (Ref: Radiological Reasoning: Algorithmic Workup of Abnormal Vaginal Bleeding with Endovaginal Sonography and Sonohysterography. AJR 2008; [id number]) 2. No other significant finding.   Electronically Signed  By: Lajean Manes M.D.  On: 11/16/2015 11:27                                                                          Exam Begun   Exam Ended       11/16/2015 10:22 AM 11/16/2015 11:20 AM  Assessment:     DUB Pelvic cramping Morbid obesity S/p ablation H/O DVT x2 after surgery in lower extremeties     Plan:     Provera challenge- rx sent in Repeat u/s in 3 weeks or sooner if bleeding persist or worsens. Counseled on possible D&C, hysterectomy or Mirena use if necessary in future. Discussed pre-menopausal bleeding.  Melody Bethesda, CNM

## 2015-11-20 ENCOUNTER — Other Ambulatory Visit: Payer: Self-pay | Admitting: Internal Medicine

## 2015-12-07 ENCOUNTER — Telehealth: Payer: Self-pay | Admitting: *Deleted

## 2015-12-07 NOTE — Telephone Encounter (Signed)
OK to keep appt for 5/12

## 2015-12-07 NOTE — Telephone Encounter (Signed)
Patient called and states she is having some bleeding issues. Patient states she is bleeding through a pad every hour and 1/2. She has an appt with Melody tomorrow. She was wondering if Melody wants to see her today or keep her appt tomorrow. Patient # is [phone]

## 2015-12-07 NOTE — Telephone Encounter (Signed)
pls advise

## 2015-12-08 ENCOUNTER — Encounter: Payer: Self-pay | Admitting: Obstetrics and Gynecology

## 2015-12-08 ENCOUNTER — Ambulatory Visit (INDEPENDENT_AMBULATORY_CARE_PROVIDER_SITE_OTHER): Payer: 59 | Admitting: Obstetrics and Gynecology

## 2015-12-08 ENCOUNTER — Ambulatory Visit (INDEPENDENT_AMBULATORY_CARE_PROVIDER_SITE_OTHER): Payer: 59

## 2015-12-08 VITALS — BP 126/71 | HR 90 | Wt 272.6 lb

## 2015-12-08 DIAGNOSIS — N938 Other specified abnormal uterine and vaginal bleeding: Secondary | ICD-10-CM | POA: Diagnosis not present

## 2015-12-08 MED ORDER — MEDROXYPROGESTERONE ACETATE 10 MG PO TABS: 10.0000 mg | ORAL_TABLET | Freq: Every day | ORAL | Status: DC

## 2015-12-08 NOTE — Progress Notes (Signed)
Subjective:     Patient ID: Maria Moore, female   DOB: February 17, 1971, 45 y.o.   MRN: [id number]  HPI See previous for prolonged and heavy vaginal bleeding- s/p ablation, treated with provera challenge and she reports slowing of bleeding to cessation after 4 days, but it returned 2 days after stopped medication and is heavier, denies pain, Has bled through clothes twice this morning.  Review of Systems See above    Objective:   Physical Exam A&O x4  well groomed obese female in no distress. Blood pressure 126/71, pulse 90, weight 272 lb 9.6 oz (123.651 kg), last menstrual period 10/21/2015.   Ultrasound Findings:  The uterus measures 11.0 x 6.2 x 7.2 cm. Echo texture is homogenous without evidence of focal masses.  The Endometrium measures 9.9 mm. This is smaller than prior ultrasound dated 11/16/15 which showed the endometrium at 2.3 cm. No cystic areas are seen within the endometrium on today's ultrasound.   Right Ovary is not visualized.  Left Ovary is enlarged and measures 6.6 x 4.6 x 5.0 cm. There is what appears to be a simple cystic structure measuring 3.8 cm at its greatest diameter. Patient does not complain of any sharp pains. Survey of the adnexa  demonstrates no obvious adnexal masses. There is no free fluid in the cul de sac.  Impression: 1. Probable left ovarian cyst, simple, measuring 3.8 cm. There appears to be vascular flow to the ovary.  2. Heavy bleeding was noted during exam.     Assessment:     DUB Left ovarian cyst     Plan:     Reviewed with Dr Marcelline Mates, and she will assume care      Maria Moore [place], CNM

## 2015-12-08 NOTE — H&P (Signed)
Subjective:    Patient is a 45 y.o. G49P0 female scheduled for Hysteroscopy D&C. Indications for procedure are dysfunctional uterine bleeding. Patient with h/o endometrial ablation.  Currently with irregular heavy menses.  Was on Provera challenge, but once completed, bleeding returned.   Pertinent Gynecological History:  Menses: flow is irregular and moderate.   Bleeding: dysfunctional uterine bleeding Contraception: tubal ligation Last mammogram: normal Date: 07/2013 Last pap: normal Date: 05/25/2013  Discussed Blood/Blood Products: no   Menstrual History: OB History    Gravida Para Term Preterm AB TAB SAB Ectopic Multiple Living   5         5      Menarche age: 65  Patient's last menstrual period was 10/21/2015.    Past Medical History  Diagnosis Date  . Palpitations   . VTE (venous thromboembolism)     post partum, post procedure, Protein C deficiency ruled out  . Depression   . Anemia   . Plantar fasciitis     Past Surgical History  Procedure Laterality Date  . D&c (other) with endometrial ablation    . Myringotomy and tubes (other)    . Extraction of wisdom teeth    . Laparoscopic tubal ligation      Philis Pique    OB History  Gravida Para Term Preterm AB SAB TAB Ectopic Multiple Living  5         5    # Outcome Date GA Lbr Len/2nd Weight Sex Delivery Anes PTL Lv  5 Gravida 2010    M Vag-Spont   Y  4 Gravida 2006    M Vag-Spont   Y  3 Gravida 2002    F Vag-Spont   Y  2 Gravida 1997    F Vag-Spont   Y  1 Gravida 1993    F Vag-Spont   Y      Social History   Social History  . Marital Status: Married    Spouse Name: N/A  . Number of Children: N/A  . Years of Education: N/A   Social History Main Topics  . Smoking status: Never Smoker   . Smokeless tobacco: Never Used  . Alcohol Use: No  . Drug Use: No  . Sexual Activity: Yes    Birth Control/ Protection: None   Other Topics Concern  . 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     Family History  Problem Relation Age of Onset  . COPD Mother   . Cancer Mother   . Early death Mother 18    Died at [facility] 26-May-2011  . Hypertension Father   . Kidney disease Father     renal calculi  . Heart disease Father 22    4 vessel CABG   . Cancer Maternal Grandmother     breast cancer  . Stroke Maternal Grandfather   . Mental illness Paternal Grandmother     Current Outpatient Prescriptions on File Prior to Visit  Medication Sig Dispense Refill  . cholecalciferol (VITAMIN D) 1000 UNITS tablet Take 1,000 Units by mouth every other day.     . citalopram (CELEXA) 20 MG tablet TAKE 1 TABLET (20 MG TOTAL) BY MOUTH DAILY. 90 tablet 0  . metoprolol (LOPRESSOR) 50 MG tablet TAKE 1 TABLET (50 MG TOTAL) BY MOUTH 2 (TWO) TIMES DAILY. 60 tablet 6   No current facility-administered medications on file prior to visit.  No Known Allergies  Review of Systems Constitutional: No recent fever/chills/sweats Respiratory: No recent cough/bronchitis Cardiovascular: No chest pain Gastrointestinal: No recent nausea/vomiting/diarrhea Genitourinary: No UTI symptoms Hematologic/lymphatic:No history of coagulopathy or recent blood thinner use    Objective:    BP 126/71 mmHg  Pulse 90  Wt 272 lb 9.6 oz (123.651 kg)  LMP 10/21/2015. Body mass index is 45.36 kg/(m^2).   General:   alert, no distress and morbidly obese  Skin:   normal  HEENT:  Normal  Neck:  Supple without Adenopathy or Thyromegaly  Lungs:   Heart:              Breasts:   Abdomen:  Pelvis:  M/S   Extremeties:  Neuro:    clear to auscultation bilaterally   Normal without murmur   Not Examined   soft, non-tender; bowel sounds normal; no masses,  no organomegaly   Exam deferred to OR  No CVAT  Warm/Dry   Normal            Labs:  Lab Results  Component Value Date   WBC 6.1 11/16/2015   HGB 12.2 11/16/2015   HCT 37.9  11/16/2015   MCV 88.6 11/16/2015   PLT 176 11/16/2015    Imaging (12/08/2015 Pelvic Ultrasound):   Ultrasound Findings:  The uterus measures 11.0 x 6.2 x 7.2 cm. Echo texture is homogenous without evidence of focal masses.  The Endometrium measures 9.9 mm. This is smaller than prior ultrasound dated 11/16/15 which showed the endometrium at 2.3 cm. No cystic areas are seen within the endometrium on today's ultrasound.   Right Ovary is not visualized.  Left Ovary is enlarged and measures 6.6 x 4.6 x 5.0 cm. There is what appears to be a simple cystic structure measuring 3.8 cm at its greatest diameter. Patient does not complain of any sharp pains. Survey of the adnexa demonstrates no obvious adnexal masses. There is no free fluid in the cul de sac.  Impression: 1. Probable left ovarian cyst, simple, measuring 3.8 cm. There appears to be vascular flow to the ovary.  2. Heavy bleeding was noted during exam.  Assessment:    Abnormal uterine bleeding Morbid obesity H/o prior endometrial ablation Left ovarian cyst H/o DVT      Plan:   1) Counseling: Procedure, risks, reasons, benefits and complications (including injury to bowel, bladder, major blood vessel, ureter, bleeding, possibility of transfusion, infection, or fistula formation) reviewed in detail. Consent signed. Preop testing ordered.  Scheduled surgery for 12/11/2015.  2) Instructions reviewed, including NPO after midnight. 3) Obesity may be a contributing factor to abnormal bleeding and can increase chance of hyperplasia/malignancy.  Discussed with patient.  4) H/o DVT, will administer DVT prophylaxis (low dose lovenox, as well as SCDs) as patient with DVT after a previous minor surgery.  5) Left ovarian cyst small asymptomatic.  No need for further management at this time.     Rubie Maid, MD [facility]

## 2015-12-08 NOTE — Addendum Note (Signed)
Addended by: Augusto Gamble on: 12/08/2015 03:37 PM   Modules accepted: Orders

## 2015-12-10 ENCOUNTER — Encounter: Payer: Self-pay | Admitting: Obstetrics and Gynecology

## 2015-12-11 ENCOUNTER — Ambulatory Visit: Payer: 59 | Admitting: Anesthesiology

## 2015-12-11 ENCOUNTER — Ambulatory Visit
Admission: RE | Admit: 2015-12-11 | Discharge: 2015-12-11 | Disposition: A | Discharge status: Home / Self Care | Payer: 59 | Source: Ambulatory Visit | Attending: Obstetrics and Gynecology | Admitting: Obstetrics and Gynecology

## 2015-12-11 ENCOUNTER — Encounter
Admission: RE | Disposition: A | Discharge status: Home / Self Care | Payer: Self-pay | Source: Ambulatory Visit | Attending: Obstetrics and Gynecology

## 2015-12-11 ENCOUNTER — Encounter: Payer: Self-pay | Admitting: *Deleted

## 2015-12-11 DIAGNOSIS — R002 Palpitations: Secondary | ICD-10-CM | POA: Diagnosis not present

## 2015-12-11 DIAGNOSIS — Z86718 Personal history of other venous thrombosis and embolism: Secondary | ICD-10-CM | POA: Insufficient documentation

## 2015-12-11 DIAGNOSIS — Z8249 Family history of ischemic heart disease and other diseases of the circulatory system: Secondary | ICD-10-CM | POA: Insufficient documentation

## 2015-12-11 DIAGNOSIS — Z79899 Other long term (current) drug therapy: Secondary | ICD-10-CM | POA: Diagnosis not present

## 2015-12-11 DIAGNOSIS — R0602 Shortness of breath: Secondary | ICD-10-CM | POA: Insufficient documentation

## 2015-12-11 DIAGNOSIS — R000 Tachycardia, unspecified: Secondary | ICD-10-CM | POA: Diagnosis not present

## 2015-12-11 DIAGNOSIS — N938 Other specified abnormal uterine and vaginal bleeding: Secondary | ICD-10-CM | POA: Diagnosis present

## 2015-12-11 DIAGNOSIS — Z825 Family history of asthma and other chronic lower respiratory diseases: Secondary | ICD-10-CM | POA: Insufficient documentation

## 2015-12-11 DIAGNOSIS — Z803 Family history of malignant neoplasm of breast: Secondary | ICD-10-CM | POA: Diagnosis not present

## 2015-12-11 DIAGNOSIS — D649 Anemia, unspecified: Secondary | ICD-10-CM | POA: Insufficient documentation

## 2015-12-11 DIAGNOSIS — Z818 Family history of other mental and behavioral disorders: Secondary | ICD-10-CM | POA: Insufficient documentation

## 2015-12-11 DIAGNOSIS — F329 Major depressive disorder, single episode, unspecified: Secondary | ICD-10-CM | POA: Diagnosis not present

## 2015-12-11 DIAGNOSIS — Z841 Family history of disorders of kidney and ureter: Secondary | ICD-10-CM | POA: Insufficient documentation

## 2015-12-11 DIAGNOSIS — Z809 Family history of malignant neoplasm, unspecified: Secondary | ICD-10-CM | POA: Insufficient documentation

## 2015-12-11 DIAGNOSIS — N939 Abnormal uterine and vaginal bleeding, unspecified: Secondary | ICD-10-CM | POA: Diagnosis not present

## 2015-12-11 HISTORY — PX: HYSTEROSCOPY WITH D & C: SHX1775

## 2015-12-11 LAB — BASIC METABOLIC PANEL
Anion gap: 5 (ref 5–15)
BUN: 11 mg/dL (ref 6–20)
CO2: 26 mmol/L (ref 22–32)
Calcium: 8.8 mg/dL — ABNORMAL LOW (ref 8.9–10.3)
Chloride: 107 mmol/L (ref 101–111)
Creatinine, Ser: 0.74 mg/dL (ref 0.44–1.00)
GFR calc Af Amer: >60 mL/min (ref >60)
GFR calc non Af Amer: >60 mL/min (ref >60)
Glucose, Bld: 98 mg/dL (ref 65–99)
Potassium: 4.1 mmol/L (ref 3.5–5.1)
Sodium: 138 mmol/L (ref 135–145)

## 2015-12-11 LAB — CBC
HCT: 32.7 % — ABNORMAL LOW (ref 35.0–47.0)
Hemoglobin: 11.2 g/dL — ABNORMAL LOW (ref 12.0–16.0)
MCH: 29.8 pg (ref 26.0–34.0)
MCHC: 34.3 g/dL (ref 32.0–36.0)
MCV: 86.8 fL (ref 80.0–100.0)
Platelets: 175 10*3/uL (ref 150–440)
RBC: 3.77 MIL/uL — ABNORMAL LOW (ref 3.80–5.20)
RDW: 14 % (ref 11.5–14.5)
WBC: 5.7 10*3/uL (ref 3.6–11.0)

## 2015-12-11 SURGERY — DILATATION AND CURETTAGE /HYSTEROSCOPY
Anesthesia: Monitor Anesthesia Care | Wound class: Clean Contaminated

## 2015-12-11 MED ORDER — DEXAMETHASONE SODIUM PHOSPHATE 10 MG/ML IJ SOLN
INTRAMUSCULAR | Status: DC | PRN
  Administered 2015-12-11: 4 mg via INTRAVENOUS

## 2015-12-11 MED ORDER — MEPERIDINE HCL 25 MG/ML IJ SOLN: 6.2500 mg | INTRAMUSCULAR | Status: DC | PRN

## 2015-12-11 MED ORDER — PROPOFOL 10 MG/ML IV BOLUS
INTRAVENOUS | Status: DC | PRN
  Administered 2015-12-11: 30 mg via INTRAVENOUS
  Administered 2015-12-11: 150 mg via INTRAVENOUS

## 2015-12-11 MED ORDER — HYDROMORPHONE HCL 1 MG/ML IJ SOLN
INTRAMUSCULAR | Status: AC
  Administered 2015-12-11: 0.5 mg via INTRAVENOUS
  Filled 2015-12-11: qty 1

## 2015-12-11 MED ORDER — METOPROLOL TARTRATE 50 MG PO TABS
ORAL_TABLET | ORAL | Status: AC
  Administered 2015-12-11: 50 mg via ORAL
  Filled 2015-12-11: qty 1

## 2015-12-11 MED ORDER — HYDROCODONE-ACETAMINOPHEN 7.5-325 MG PO TABS: 1.0000 | ORAL_TABLET | Freq: Once | ORAL | Status: DC | PRN

## 2015-12-11 MED ORDER — LACTATED RINGERS IV SOLN
INTRAVENOUS | Status: DC
  Administered 2015-12-11: 10:00:00 via INTRAVENOUS

## 2015-12-11 MED ORDER — MIDAZOLAM HCL 2 MG/2ML IJ SOLN
INTRAMUSCULAR | Status: DC | PRN
  Administered 2015-12-11: 2 mg via INTRAVENOUS

## 2015-12-11 MED ORDER — HYDROCODONE-ACETAMINOPHEN 5-325 MG PO TABS: 1.0000 | ORAL_TABLET | Freq: Four times a day (QID) | ORAL | Status: DC | PRN

## 2015-12-11 MED ORDER — ENOXAPARIN SODIUM 40 MG/0.4ML ~~LOC~~ SOLN
40.0000 mg | SUBCUTANEOUS | Status: AC
  Administered 2015-12-11: 40 mg via SUBCUTANEOUS
  Filled 2015-12-11: qty 0.4

## 2015-12-11 MED ORDER — FENTANYL CITRATE (PF) 100 MCG/2ML IJ SOLN
INTRAMUSCULAR | Status: DC | PRN
  Administered 2015-12-11 (×3): 50 ug via INTRAVENOUS

## 2015-12-11 MED ORDER — FAMOTIDINE 20 MG PO TABS
ORAL_TABLET | ORAL | Status: AC
  Administered 2015-12-11: 20 mg
  Filled 2015-12-11: qty 1

## 2015-12-11 MED ORDER — ONDANSETRON HCL 4 MG/2ML IJ SOLN
INTRAMUSCULAR | Status: DC | PRN
  Administered 2015-12-11: 4 mg via INTRAVENOUS

## 2015-12-11 MED ORDER — SILVER NITRATE-POT NITRATE 75-25 % EX MISC
CUTANEOUS | Status: AC
Start: 2015-12-11 — End: 2015-12-11
  Filled 2015-12-11: qty 2

## 2015-12-11 MED ORDER — KETOROLAC TROMETHAMINE 30 MG/ML IJ SOLN
INTRAMUSCULAR | Status: DC | PRN
  Administered 2015-12-11: 30 mg via INTRAVENOUS

## 2015-12-11 MED ORDER — SILVER NITRATE-POT NITRATE 75-25 % EX MISC
CUTANEOUS | Status: DC | PRN
  Administered 2015-12-11: 2

## 2015-12-11 MED ORDER — METOPROLOL TARTRATE 50 MG PO TABS
50.0000 mg | ORAL_TABLET | Freq: Once | ORAL | Status: AC
  Administered 2015-12-11: 50 mg via ORAL

## 2015-12-11 MED ORDER — LIDOCAINE HCL (CARDIAC) 20 MG/ML IV SOLN
INTRAVENOUS | Status: DC | PRN
  Administered 2015-12-11: 80 mg via INTRAVENOUS

## 2015-12-11 MED ORDER — HYDROMORPHONE HCL 1 MG/ML IJ SOLN
0.2500 mg | INTRAMUSCULAR | Status: DC | PRN
  Administered 2015-12-11 (×4): 0.5 mg via INTRAVENOUS

## 2015-12-11 MED ORDER — IBUPROFEN 800 MG PO TABS: 800.0000 mg | ORAL_TABLET | Freq: Three times a day (TID) | ORAL | Status: DC | PRN

## 2015-12-11 MED ORDER — ONDANSETRON HCL 4 MG/2ML IJ SOLN: 4.0000 mg | Freq: Once | INTRAMUSCULAR | Status: DC | PRN

## 2015-12-11 SURGICAL SUPPLY — 14 items
CATH ROBINSON RED A/P 16FR (CATHETERS) ×2 IMPLANT
GLOVE BIO SURGEON STRL SZ 6 (GLOVE) ×3 IMPLANT
GLOVE BIOGEL PI IND STRL 6.5 (GLOVE) ×1 IMPLANT
GLOVE BIOGEL PI INDICATOR 6.5 (GLOVE) ×2
GOWN STRL REUS W/ TWL LRG LVL3 (GOWN DISPOSABLE) ×2 IMPLANT
GOWN STRL REUS W/TWL LRG LVL3 (GOWN DISPOSABLE) ×4
IV LACTATED RINGERS 1000ML (IV SOLUTION) ×2 IMPLANT
KIT RM TURNOVER CYSTO AR (KITS) ×2 IMPLANT
PACK DNC HYST (MISCELLANEOUS) ×2 IMPLANT
PAD OB MATERNITY 4.3X12.25 (PERSONAL CARE ITEMS) ×2 IMPLANT
PAD PREP 24X41 OB/GYN DISP (PERSONAL CARE ITEMS) ×2 IMPLANT
SOL PREP PVP 2OZ (MISCELLANEOUS) ×2
SOLUTION PREP PVP 2OZ (MISCELLANEOUS) ×1 IMPLANT
SURGILUBE 2OZ TUBE FLIPTOP (MISCELLANEOUS) ×2 IMPLANT

## 2015-12-11 NOTE — Op Note (Signed)
Hysteroscopy Procedure Note  Indications: 45 y.o. [id number] female with dysfunctional uterine bleeding, perimenopausal status.  H/o prior endometrial ablation  Pre-operative Diagnosis: Perimenopausal dysfunctional uterine bleeding  Post-operative Diagnosis: same  Surgeon: Surgeon(s) and Role:    * Rubie Maid, MD - Primary   Assistants: None  Procedure:     * DILATATION & CURETTAGE/HYSTEROSCOPY - Choice  Anesthesia: General endotracheal anesthesia  ASA Class: 3   Findings: Uterus sounded to 10 cm.  Proliferative endometrium.  No intrauterine masses.  Tubal ostia were visualized bilaterally.    Procedure Details: Patient was seen in the preoperative holding area where the consent was reviewed. Patient was consented for the following procedure: Hysteroscopy Dilation and Curettage. Pt was taken to the operating room # 9.    The patient was then placed under general anesthesia without difficulty.  She was then prepped and draped in the normal sterile fashion, and placed in the dorsal lithotomy position.  A time out was performed.  An exam under anesthesia was performed with the findings noted above. A sterile speculum was inserted into vagina. A single-tooth tenaculum was used to grasp the anterior lip of the cervix. Cervical dilation was performed.  A 5 mm hysteroscope was introduced into the uterus under direct visualization. The cavity was allowed to fill, and then the entire cavity was explored with the findings described above. The hysteroscope was removed, and a sharp curette was then passed into the uterus and endometrial sampling was collected for pathology. The tenaculum was removed and hemostasis was achieved with silver nitrate sticks. The speculum was removed from the vagina.   All instrument and sponge counts were correct at the end of the procedure x 2.  The patient tolerated the procedure well.  She was awakened and taken to the PACU in stable condition.   Estimated Blood  Loss:  20 ml      Drains: None         Total IV Fluids:  400 ml of Lactated Ringers  Specimens:  Endometrial curettings         Implants: None         Complications:  None; patient tolerated the procedure well.         Disposition: PACU - hemodynamically stable.         Condition: stable   Rubie Maid, MD [facility]

## 2015-12-11 NOTE — Transfer of Care (Signed)
Immediate Anesthesia Transfer of Care Note  Patient: Maria Moore  Procedure(s) Performed: Procedure(s): DILATATION AND CURETTAGE /HYSTEROSCOPY (N/A)  Patient Location: PACU  Anesthesia Type:General  Level of Consciousness: awake, alert  and responds to stimulation  Airway & Oxygen Therapy: Patient Spontanous Breathing and Patient connected to face mask oxygen  Post-op Assessment: Report given to RN and Post -op Vital signs reviewed and stable  Post vital signs: Reviewed and stable  Last Vitals:  Filed Vitals:   12/11/15 0917 12/11/15 1138  BP: 120/58 148/81  Pulse: 93 78  Temp: 36.8 C   Resp: 16 10    Last Pain:  Filed Vitals:   12/11/15 1138  PainSc: 5          Complications: No apparent anesthesia complications

## 2015-12-11 NOTE — Anesthesia Postprocedure Evaluation (Signed)
Anesthesia Post Note  Patient: Maria Moore  Procedure(s) Performed: Procedure(s) (LRB): DILATATION AND CURETTAGE /HYSTEROSCOPY (N/A)  Patient location during evaluation: PACU Anesthesia Type: General Level of consciousness: awake and alert Pain management: pain level controlled Vital Signs Assessment: post-procedure vital signs reviewed and stable Respiratory status: spontaneous breathing, nonlabored ventilation, respiratory function stable and patient connected to nasal cannula oxygen Cardiovascular status: blood pressure returned to baseline and stable Postop Assessment: no signs of nausea or vomiting Anesthetic complications: no    Last Vitals:  Filed Vitals:   12/11/15 1219 12/11/15 1222  BP:  145/66  Pulse: 75 68  Temp:    Resp: 14 12    Last Pain:  Filed Vitals:   12/11/15 1224  PainSc: 5                  Annise Boran

## 2015-12-11 NOTE — Anesthesia Preprocedure Evaluation (Addendum)
Anesthesia Evaluation  Patient identified by MRN, date of birth, ID band Patient awake    Reviewed: Allergy & Precautions, NPO status , Patient's Chart, lab work & pertinent test results, reviewed documented beta blocker date and time   Airway Mallampati: III  TM Distance: >3 FB     Dental  (+) Dental Advisory Given   Pulmonary shortness of breath,    breath sounds clear to auscultation       Cardiovascular negative cardio ROS  + dysrhythmias (tachycardia)  Rhythm:Regular Rate:Normal     Neuro/Psych PSYCHIATRIC DISORDERS Depression negative neurological ROS     GI/Hepatic negative GI ROS, Neg liver ROS,   Endo/Other  negative endocrine ROS  Renal/GU negative Renal ROS  negative genitourinary   Musculoskeletal   Abdominal   Peds negative pediatric ROS (+)  Hematology  (+) anemia ,   Anesthesia Other Findings   Reproductive/Obstetrics negative OB ROS                            Anesthesia Physical Anesthesia Plan  ASA: III  Anesthesia Plan: MAC and General   Post-op Pain Management:    Induction: Intravenous  Airway Management Planned: LMA and Oral ETT  Additional Equipment:   Intra-op Plan:   Post-operative Plan: Extubation in OR  Informed Consent: I have reviewed the patients History and Physical, chart, labs and discussed the procedure including the risks, benefits and alternatives for the proposed anesthesia with the patient or authorized representative who has indicated his/her understanding and acceptance.   Dental advisory given  Plan Discussed with:   Anesthesia Plan Comments:        Anesthesia Quick Evaluation

## 2015-12-11 NOTE — Anesthesia Procedure Notes (Signed)
Procedure Name: LMA Insertion Performed by: Lance Muss Pre-anesthesia Checklist: Patient identified, Timeout performed, Emergency Drugs available, Suction available and Patient being monitored Patient Re-evaluated:Patient Re-evaluated prior to inductionOxygen Delivery Method: Circle system utilized Preoxygenation: Pre-oxygenation with 100% oxygen Intubation Type: IV induction Ventilation: Mask ventilation without difficulty LMA: LMA inserted LMA Size: 4.0 Number of attempts: 1 Tube secured with: Tape Dental Injury: Teeth and Oropharynx as per pre-operative assessment

## 2015-12-11 NOTE — Discharge Instructions (Signed)
°  Hysteroscopy, Care After Refer to this sheet in the next few weeks. These instructions provide you with information on caring for yourself after your procedure. Your health care provider may also give you more specific instructions. Your treatment has been planned according to current medical practices, but problems sometimes occur. Call your health care provider if you have any problems or questions after your procedure.  WHAT TO EXPECT AFTER THE PROCEDURE After your procedure, it is typical to have the following:  You may have some cramping. This normally lasts for a couple days.  You may have bleeding. This can vary from light spotting for a few days to menstrual-like bleeding for 3-7 days. HOME CARE INSTRUCTIONS  Rest for the first 1-2 days after the procedure.  Only take over-the-counter or prescription medicines as directed by your health care provider. Do not take aspirin. It can increase the chances of bleeding.  Take showers instead of baths for 2 weeks or as directed by your health care provider.  Do not drive for 24 hours or as directed.  Do not drink alcohol while taking pain medicine.  Do not use tampons, douche, or have sexual intercourse for 2 weeks or until your health care provider says it is okay.  Take your temperature twice a day for 4-5 days. Write it down each time.  Follow your health care provider's advice about diet, exercise, and lifting.  If you develop constipation, you may:  Take a mild laxative if your health care provider approves.  Add bran foods to your diet.  Drink enough fluids to keep your urine clear or pale yellow.  Try to have someone with you or available to you for the first 24-48 hours, especially if you were given a general anesthetic.  Follow up with your health care provider as directed. SEEK MEDICAL CARE IF:  You feel dizzy or lightheaded.  You feel sick to your stomach (nauseous).  You have abnormal vaginal discharge.  You  have a rash.  You have pain that is not controlled with medicine. SEEK IMMEDIATE MEDICAL CARE IF:  You have bleeding that is heavier than a normal menstrual period.  You have a fever.  You have increasing cramps or pain, not controlled with medicine.  You have new belly (abdominal) pain.  You pass out.  You have pain in the tops of your shoulders (shoulder strap areas).  You have shortness of breath.   This information is not intended to replace advice given to you by your health care provider. Make sure you discuss any questions you have with your health care provider.   Document Released: 05/05/2013 Document Reviewed: 05/05/2013 Elsevier Interactive Patient Education [employer].

## 2015-12-11 NOTE — H&P (Signed)
UPDATE TO PREVIOUS HISTORY AND PHYSICAL  The patient has been seen and examined.  H&P is up to date, no changes noted.  Patient can proceed to the OR for scheduled procedure. Will receive prophylactic dose of Lovenox, and have SCDs in OR for DVT prophylaxis due to prior h/o DVT.   Rubie Maid, MD 12/11/2015 10:28 AM

## 2015-12-12 LAB — SURGICAL PATHOLOGY

## 2015-12-13 ENCOUNTER — Telehealth: Payer: Self-pay | Admitting: Obstetrics and Gynecology

## 2015-12-13 NOTE — Telephone Encounter (Signed)
Vaginal bleeding: red in color, changing pad every 2-3 hrs., and c/o cramping. This vaginal bleeding just restarted back today after it had stopped for 2 days. Would like to know what to do next.

## 2015-12-13 NOTE — Telephone Encounter (Signed)
Ms. Maria Moore called saying she began bleeding today. She said the color is bright pink/red and it's heavier today than its been the past two days. She's wondering if she should be concerned about this since she had surgery on Monday. She'd like a phone call.  Pt's ph# [phone] Thank you.

## 2016-01-15 ENCOUNTER — Encounter: Payer: 59 | Admitting: Internal Medicine

## 2016-01-15 DIAGNOSIS — Z0289 Encounter for other administrative examinations: Secondary | ICD-10-CM

## 2016-01-20 ENCOUNTER — Other Ambulatory Visit: Payer: Self-pay | Admitting: Nurse Practitioner

## 2016-01-23 NOTE — Telephone Encounter (Signed)
refilled 

## 2016-01-23 NOTE — Telephone Encounter (Signed)
Pt is requesting a refill, last filled on 10/24/15 #90. Last OV 11/09/15. Ok to refill?

## 2016-01-31 ENCOUNTER — Ambulatory Visit (INDEPENDENT_AMBULATORY_CARE_PROVIDER_SITE_OTHER): Payer: 59 | Admitting: Obstetrics and Gynecology

## 2016-01-31 ENCOUNTER — Encounter: Payer: Self-pay | Admitting: Obstetrics and Gynecology

## 2016-01-31 VITALS — BP 108/69 | HR 62 | Ht 64.0 in | Wt 268.6 lb

## 2016-01-31 DIAGNOSIS — Z9889 Other specified postprocedural states: Secondary | ICD-10-CM | POA: Diagnosis not present

## 2016-01-31 DIAGNOSIS — N921 Excessive and frequent menstruation with irregular cycle: Secondary | ICD-10-CM

## 2016-02-04 NOTE — Progress Notes (Signed)
GYNECOLOGY PROGRESS NOTE  Subjective:    Patient ID: Maria Moore, female    DOB: 01-10-1971, 45 y.o.   MRN: [id number]  HPI  Patient is a 45 y.o. [id number] female who presents as a referral from [facility], CNM for complaints of continued abnormal uterine bleeding.  Patient with remote h/o endometrial ablation ~ 7 years ago, began noting heavy persistent heavy bleeding ~ bleeding.  Had a D&C performed for bleeding approximately 2 months ago with benign pathology.  Notes that bleeding stopped initially, however after approximately 1 month she began noting daily light bleeding. Desires to discuss management options. Denies dizziness, headaches, fatigue, SOB, or chest pain.   The following portions of the patient's history were reviewed and updated as appropriate: allergies, current medications, past family history, past medical history, past social history, past surgical history and problem list.  Review of Systems Pertinent items noted in HPI and remainder of comprehensive ROS otherwise negative.   Objective:   Blood pressure 108/69, pulse 62, height 5\' 4"  (1.626 m), weight 268 lb 9 oz (121.819 kg). General appearance: alert and no distress, moderately obese Abdomen: soft, non-tender; bowel sounds normal; no masses,  no organomegaly Pelvic: external genitalia normal, rectovaginal septum normal.  Vagina without discharge, scant dark red blood noted in vaginal vault.  Cervix normal appearing, no lesions and no motion tenderness.  Uterus mobile, nontender, normal shape and size.  Adnexae non-palpable, nontender bilaterally.  Extremities: extremities normal, atraumatic, no cyanosis or edema Neurologic: Grossly normal   Lab Results  Component Value Date   WBC 5.7 12/11/2015   HGB 11.2* 12/11/2015   HCT 32.7* 12/11/2015   MCV 86.8 12/11/2015   PLT 175 12/11/2015    Imaging (Pelvic Ultrasound) 12/11/2015:  Indications: Dysfunctional Uterine Bleeding Findings:  The uterus  measures 11.0 x 6.2 x 7.2 cm. Echo texture is homogenous without evidence of focal masses.  The Endometrium measures 9.9 mm. This is smaller than prior ultrasound dated 11/16/15 which showed the endometrium at 2.3 cm. No cystic areas are seen within the endometrium on today's ultrasound.   Right Ovary is not visualized.  Left Ovary is enlarged and measures 6.6 x 4.6 x 5.0 cm. There is what appears to be a simple cystic structure measuring 3.8 cm at its greatest diameter. Patient does not complain of any sharp pains. Survey of the adnexa demonstrates no obvious adnexal masses. There is no free fluid in the cul de sac.  Impression: 1. Probable left ovarian cyst, simple, measuring 3.8 cm. There appears to be vascular flow to the ovary.  2. Heavy bleeding was noted during exam.  Recommendations: 1.Clinical correlation with the patient's History and Physical Exam.   Pathology: (12/11/2015)  DIAGNOSIS:  A. ENDOMETRIUM; DILATATION AND CURETTAGE:  - BLOOD AND PROLIFERATIVE ENDOMETRIUM WITH BREAKDOWN CHANGE.  - NEGATIVE FOR HYPERPLASIA AND CARCINOMA.   Assessment:   Abnormal uterine bleeding, possibly perimenopausal  Plan:   Discussed likely diagnosis with patient.  Discussed management options for abnormal uterine bleeding again with  including tranexamic acid (Lysteda), oral progesterone (OCPs or Provera), Depo Provera, Mirena IUD, repeat endometrial ablation (Novasure/Hydrothermal Ablation) or hysterectomy as definitive surgical management.  Discussed risks and benefits of each method.   Patient unsure of desires, would like some time to think it over and discuss with husband.  May be leaning more toward an IUD vs repeating ablation.  Printed patient education handouts were given to the patient to review at home.    A total of  15 minutes were spent face-to-face with the patient during this encounter and over half of that time dealt with counseling and coordination of care.   Rubie Maid, MD [facility]

## 2016-02-05 ENCOUNTER — Encounter: Payer: Self-pay | Admitting: Internal Medicine

## 2016-02-05 ENCOUNTER — Ambulatory Visit (INDEPENDENT_AMBULATORY_CARE_PROVIDER_SITE_OTHER): Payer: 59 | Admitting: Internal Medicine

## 2016-02-05 VITALS — BP 128/92 | HR 100 | Temp 98.2°F | Resp 10 | Ht 65.0 in | Wt 269.1 lb

## 2016-02-05 DIAGNOSIS — Z0000 Encounter for general adult medical examination without abnormal findings: Secondary | ICD-10-CM

## 2016-02-05 DIAGNOSIS — Z1239 Encounter for other screening for malignant neoplasm of breast: Secondary | ICD-10-CM

## 2016-02-05 NOTE — Patient Instructions (Addendum)
Your mammogram has been ordered  Please send me the screening labs from [facility] when you have them done   This is  Dr. Lupita Dawn  example of a  "Low GI"  Diet:  It will allow you to lose 4 to 8  lbs  per month if you follow it carefully.  Your goal with exercise is a minimum of 30 minutes of aerobic exercise 5 days per week (Walking does not count once it becomes easy!)   Goal with weight loss is 4 lbs per month    All of the foods can be found at [facility] , [employer],  and in bulk at [employer].  The Atkins protein bars and shakes are available in more varieties at [employer], [employer] and [place].     7 AM Breakfast:  Choose from the following:  Low carbohydrate Protein  Shakes (I recommend the  Premier Protein chocolate shakes,  EAS AdvantEdge "Carb Control" shakes  Or the Atkins shakes all are under 3 net carbs)     a scrambled egg/bacon/cheese burrito made with Mission's "carb balance" whole wheat tortilla  (about 10 net carbs )  Regulatory affairs officer (basically a quiche without the pastry crust) that is eaten warm and very convenient way to get your eggs.  8 carbs)  If you make your own protein shakes, avoid bananas and pineapple,  And use low carb greek yogurt or original /unsweetened almond or soy milk    Avoid cereal and bananas, oatmeal and cream of wheat and grits. They are loaded with carbohydrates!   10 AM: high protein snack:  Protein bar by Atkins (the snack size, under 200 cal, usually < 6 net carbs).    A stick of cheese:  Around 1 carb,  100 cal     Dannon Light n Fit [place] Yogurt  (80 cal, 8 carbs)  Other so called "protein bars" and Greek yogurts tend to be loaded with carbohydrates.  Remember, in food advertising, the word "energy" is synonymous for " carbohydrate."  Lunch:   A Sandwich using the bread choices listed, Can use any  Eggs,  lunchmeat, grilled meat or canned tuna), avocado, regular mayo/mustard  and cheese.  A Salad using blue cheese,  ranch,  Goddess or vinagrette,  Avoid taco shells, croutons or "confetti" and no "candied nuts" but regular nuts OK.   No pretzels, nabs  or chips.  Pickles and miniature sweet peppers are a good low carb alternative that provide a "crunch"  The bread is the only source of carbohydrate in a sandwich and  can be decreased by trying some of the attached alternatives to traditional loaf bread   Avoid "Low fat dressings, as well as Howe dressings They are loaded with sugar!   3 PM/ Mid day  Snack:  Consider  1 ounce of  almonds, walnuts, pistachios, pecans, peanuts,  Macadamia nuts or a nut medley.  Avoid "granola and granola bars "  Mixed nuts are ok in moderation as long as there are no raisins,  cranberries or dried fruit.   KIND bars are OK if you get the low glycemic index variety   Try the prosciutto/mozzarella cheese sticks by [employer]  In deli /backery section   High protein      6 PM  Dinner:     Meat/fowl/fish with a green salad, and either broccoli, cauliflower, green beans, spinach, brussel sprouts or  Lima beans. DO NOT BREAD THE PROTEIN!!  There is a low carb pasta by Dreamfield's that is acceptable and tastes great: only 5 digestible carbs/serving.( All grocery stores but BJs carry it ) Several ready made meals are available low carb:   Try Michel Angelo's chicken piccata or chicken or eggplant parm over low carb pasta.(Lowes and BJs)   Marjory Lies Sanchez's "Carnitas" (pulled pork, no sauce,  0 carbs) or his beef pot roast to make a dinner burrito (at [employer])  Pesto over  Grilled /baked fish Fish or ver low carb pasta (bj's sells a good quality pesto in the center refrigerated section  of the deli   Try satueeing  Cheral Marker with mushroooms as a good side   [employer] makes a mashed cauliflower that tastes like mashed potatoes! Seriously!!  Whole wheat pasta is still full of digestible carbs and  Not as low in glycemic index as Dreamfield's.   Brown rice is  still rice,  So skip the rice and noodles if you eat [place] or [place] (or at least limit to 1/2 cup)  9 PM snack :   Breyer's "low carb" fudgsicle or  ice cream bar (Carb Smart line), or  Weight Watcher's ice cream bar , or another "no sugar added" ice cream;  a serving of fresh berries/cherries with whipped cream   Cheese or DANNON'S LlGHT N FIT GREEK YOGURT  8 ounces of [place] Diamond unsweetened almond/cococunut milk    Treat yourself to a parfait made with whipped cream blueberiies, walnuts and vanilla greek yogurt  Avoid bananas, pineapple, grapes  and watermelon on a regular basis because they are high in sugar.  THINK OF THEM AS DESSERT  Remember that snack Substitutions should be less than 10 NET carbs per serving and meals < 20 carbs. Remember to subtract fiber grams to get the "net carbs."

## 2016-02-05 NOTE — Progress Notes (Signed)
Pre-visit discussion using our clinic review tool. No additional management support is needed unless otherwise documented below in the visit note.  

## 2016-02-05 NOTE — Progress Notes (Signed)
Patient ID: Maria Moore, female    DOB: [birth year]  Age: 45 y.o. MRN: [id number]  The patient is here for annual nongyn preventiveexamination and management of other chronic and acute problems.   PAP normal April 2017 endometrial biopsy  May for DUB normal .  Still bleeding   The risk factors are reflected in the social history.  The roster of all physicians providing medical care to patient - is listed in the Snapshot section of the chart.  Home safety : The patient has smoke detectors in the home. They wear seatbelts.  There are no firearms at home. There is no violence in the home.   There is no risks for hepatitis, STDs or HIV. There is no   history of blood transfusion. They have no travel history to infectious disease endemic areas of the world.  The patient has seen their dentist in the last six month. They have seen their eye doctor in the last year. T They do not  have excessive sun exposure. Discussed the need for sun protection: hats, long sleeves and use of sunscreen if there is significant sun exposure.   Diet: the importance of a healthy diet is discussed. They do have a healthy diet.  The benefits of regular aerobic exercise were discussed. She walks 4 times per week ,  20 minutes.   Depression screen: there are no signs or vegative symptoms of depression- irritability, change in appetite, anhedonia, sadness/tearfullness. .  The following portions of the patient's history were reviewed and updated as appropriate: allergies, current medications, past family history, past medical history,  past surgical history, past social history  and problem list.  Visual acuity was not assessed per patient preference since she has regular follow up with her ophthalmologist. Hearing and body mass index were assessed and reviewed.   During the course of the visit the patient was educated and counseled about appropriate screening and preventive services including : fall prevention ,  diabetes screening, nutrition counseling, colorectal cancer screening, and recommended immunizations.    CC: The primary encounter diagnosis was Breast cancer screening. Diagnoses of Obesity, Class III, BMI 40-49.9 (morbid obesity) ([place]) and Encounter for preventive health examination were also pertinent to this visit.  Obesity.  Wants to lose weight.   Goal 28 lbs Diet reviewed,  Exercise reviewed.   History [place] has a past medical history of Palpitations; VTE (venous thromboembolism); Depression; Anemia; and Plantar fasciitis.   She has past surgical history that includes D&C (otheR); myringotomy and tubes (otheR); extraction of wisdom teeth; Laparoscopic tubal ligation; Tubal ligation; Endometrial ablation; and Hysteroscopy w/D&C (N/A, 12/11/2015).   Her family history includes COPD in her mother; Cancer in her maternal grandmother and mother; Early death (age of onset: 79) in her mother; Heart disease (age of onset: 53) in her father; Hypertension in her father; Kidney disease in her father; Mental illness in her paternal grandmother; Stroke in her maternal grandfather.She reports that she has never smoked. She has never used smokeless tobacco. She reports that she does not drink alcohol or use illicit drugs.  Outpatient Prescriptions Prior to Visit  Medication Sig Dispense Refill  . cholecalciferol (VITAMIN D) 1000 UNITS tablet Take 1,000 Units by mouth every other day.     . citalopram (CELEXA) 20 MG tablet TAKE 1 TABLET (20 MG TOTAL) BY MOUTH DAILY. 90 tablet 1  . ibuprofen (ADVIL,MOTRIN) 800 MG tablet Take 1 tablet (800 mg total) by mouth every 8 (eight) hours as needed. 60 tablet  1  . metoprolol (LOPRESSOR) 50 MG tablet TAKE 1 TABLET (50 MG TOTAL) BY MOUTH 2 (TWO) TIMES DAILY. 60 tablet 6   No facility-administered medications prior to visit.    Review of Systems   Patient denies headache, fevers, malaise, unintentional weight loss, skin rash, eye pain, sinus congestion and sinus  pain, sore throat, dysphagia,  hemoptysis , cough, dyspnea, wheezing, chest pain, palpitations, orthopnea, edema, abdominal pain, nausea, melena, diarrhea, constipation, flank pain, dysuria, hematuria, urinary  Frequency, nocturia, numbness, tingling, seizures,  Focal weakness, Loss of consciousness,  Tremor, insomnia, depression, anxiety, and suicidal ideation.      Objective:  BP 128/92 mmHg  Pulse 100  Temp(Src) 98.2 F (36.8 C) (Oral)  Resp 10  Ht 5\' 5"  (1.651 m)  Wt 269 lb 2 oz (122.074 kg)  BMI 44.78 kg/m2  SpO2 96%  LMP 09/16/2015  Physical Exam   General appearance: alert, cooperative and appears stated age Head: Normocephalic, without obvious abnormality, atraumatic Eyes: conjunctivae/corneas clear. PERRL, EOM's intact. Fundi benign. Ears: normal TM's and external ear canals both ears Nose: Nares normal. Septum midline. Mucosa normal. No drainage or sinus tenderness. Throat: lips, mucosa, and tongue normal; teeth and gums normal Neck: no adenopathy, no carotid bruit, no JVD, supple, symmetrical, trachea midline and thyroid not enlarged, symmetric, no tenderness/mass/nodules Lungs: clear to auscultation bilaterally Breasts: normal appearance, no masses or tenderness Heart: regular rate and rhythm, S1, S2 normal, no murmur, click, rub or gallop Abdomen: soft, non-tender; bowel sounds normal; no masses,  no organomegaly Extremities: extremities normal, atraumatic, no cyanosis or edema Pulses: 2+ and symmetric Skin: Skin color, texture, turgor normal. No rashes or lesions Neurologic: Alert and oriented X 3, normal strength and tone. Normal symmetric reflexes. Normal coordination and gait.     Assessment & Plan:   Problem List Items Addressed This Visit    Obesity, Class III, BMI 40-49.9 (morbid obesity) ([place])    I have addressed  BMI and recommended wt loss of 10% of body weight over the next 6 months using a low fat, low starch, high protein  fruit/vegetable based  Mediterranean diet and 30 minutes of aerobic exercise a minimum of 5 days per week.        Encounter for preventive health examination    Annual comprehensive preventive exam was done as well as an evaluation and management of chronic conditions .  During the course of the visit the patient was educated and counseled about appropriate screening and preventive services including :  diabetes screening, lipid analysis with projected  10 year  risk for CAD , nutrition counseling, breast, cervical and colorectal cancer screening, and recommended immunizations.  Printed recommendations for health maintenance screenings was given.        Other Visit Diagnoses    Breast cancer screening    -  Primary    Relevant Orders    MM DIGITAL SCREENING BILATERAL       I am having Ms. Tofte maintain her cholecalciferol, metoprolol, ibuprofen, and citalopram.  No orders of the defined types were placed in this encounter.    There are no discontinued medications.  Follow-up: No Follow-up on file.   Crecencio Mc, MD

## 2016-02-06 DIAGNOSIS — Z0000 Encounter for general adult medical examination without abnormal findings: Secondary | ICD-10-CM | POA: Insufficient documentation

## 2016-02-06 NOTE — Assessment & Plan Note (Addendum)
Annual comprehensive preventive exam was done as well as an evaluation and management of chronic conditions .  During the course of the visit the patient was educated and counseled about appropriate screening and preventive services including :  diabetes screening, lipid analysis with projected  10 year  risk for CAD , nutrition counseling, breast, cervical and colorectal cancer screening, and recommended immunizations.  Printed recommendations for health maintenance screenings was given 

## 2016-02-06 NOTE — Assessment & Plan Note (Signed)
I have addressed  BMI and recommended wt loss of 10% of body weight over the next 6 months using a low fat, low starch, high protein  fruit/vegetable based Mediterranean diet and 30 minutes of aerobic exercise a minimum of 5 days per week.   

## 2016-02-15 ENCOUNTER — Telehealth: Payer: Self-pay | Admitting: Obstetrics and Gynecology

## 2016-02-15 NOTE — Telephone Encounter (Signed)
Called pt & LM for pt to call back

## 2016-02-15 NOTE — Telephone Encounter (Signed)
Patient wanted to discuss with you the decision she has made regarding her heavy bleeding/ surgery. You can reach her at [phone]. Thanks

## 2016-02-16 NOTE — Telephone Encounter (Signed)
Spoke with pt she states that after some thought she has decided that a partial hysterectomy would be best for her. Advised pt that was fine as she and Dr.Cherry had discussed this previously. Transferred pt to front desk to be scheduled for pre-op appt. Of note pt states that her last period was very heavy (soaking through clothes) bleeding is now stopped.

## 2016-02-29 ENCOUNTER — Ambulatory Visit (INDEPENDENT_AMBULATORY_CARE_PROVIDER_SITE_OTHER): Payer: 59 | Admitting: Internal Medicine

## 2016-02-29 ENCOUNTER — Encounter: Payer: Self-pay | Admitting: Internal Medicine

## 2016-02-29 VITALS — BP 120/74 | HR 74 | Ht 64.75 in | Wt 265.5 lb

## 2016-02-29 DIAGNOSIS — R002 Palpitations: Secondary | ICD-10-CM | POA: Diagnosis not present

## 2016-02-29 DIAGNOSIS — G901 Familial dysautonomia [Riley-Day]: Secondary | ICD-10-CM

## 2016-02-29 DIAGNOSIS — G909 Disorder of the autonomic nervous system, unspecified: Secondary | ICD-10-CM | POA: Diagnosis not present

## 2016-02-29 MED ORDER — METOPROLOL TARTRATE 50 MG PO TABS: ORAL_TABLET | ORAL | 3 refills | Status: AC

## 2016-02-29 NOTE — Progress Notes (Signed)
Electrophysiology Office Note   Date:  02/29/2016   ID:  ABEGAYLE Moore, DOB 02/20/1971, MRN [id number]  PCP:  Crecencio Mc, MD  Cardiologist:    Primary Electrophysiologist:  Virl Axe, MD    Chief Complaint  Patient presents with  . Other    1 year follow up. Meds reviewed by the patient verbally. "doing well."      History of Present Illness: Maria Moore is a 45 y.o. female  Seen in followup for symptoms of syncope and sinus tachycardia most consistent with a dysautonomia. Her daughter has been diagnosed with long QT syndrome albeit  genetic testing was not yet completed  She continues to complain of periodic symptoms of chest discomfort and dyspnea it occurs mostly near the and of her dosing interval with metoprolol.  Myoview 2015 Normal   She has had no  recurrent syncope  Lightheadedness is worse around illness, the heat, and she's also been having problems with metromenorrhagia. Hysterectomy is anticipated     she is tolerating her medications without difficulty.     Past Medical History:  Diagnosis Date  . Anemia   . Depression   . Palpitations   . Plantar fasciitis   . VTE (venous thromboembolism)    x 2. 1st was post partum, 2nd was post procedure, Protein C deficiency ruled out   Past Surgical History:  Procedure Laterality Date  . D&C (otheR)    . ENDOMETRIAL ABLATION    . extraction of wisdom teeth    . HYSTEROSCOPY W/D&C N/A 12/11/2015   Procedure: DILATATION AND CURETTAGE /HYSTEROSCOPY;  Surgeon: Rubie Maid, MD;  Location: [facility] ORS;  Service: Gynecology;  Laterality: N/A;  . LAPAROSCOPIC TUBAL LIGATION     [place]  . myringotomy and tubes (otheR)    . TUBAL LIGATION       Current Outpatient Prescriptions  Medication Sig Dispense Refill  . cholecalciferol (VITAMIN D) 1000 UNITS tablet Take 1,000 Units by mouth every other day.     . citalopram (CELEXA) 20 MG tablet TAKE 1 TABLET (20 MG TOTAL) BY MOUTH DAILY. 90 tablet 1  .  metoprolol (LOPRESSOR) 50 MG tablet TAKE 1 TABLET (50 MG TOTAL) BY MOUTH 2 (TWO) TIMES DAILY. 60 tablet 6   No current facility-administered medications for this visit.     Allergies:   Review of patient's allergies indicates no known allergies.   Social History:  The patient  reports that she has never smoked. She has never used smokeless tobacco. She reports that she does not drink alcohol or use drugs.   Family History:  The patient's    family history includes COPD in her mother; Cancer in her maternal grandmother and mother; Early death (age of onset: 70) in her mother; Heart disease (age of onset: 67) in her father; Hypertension in her father; Kidney disease in her father; Mental illness in her paternal grandmother; Stroke in her maternal grandfather.    ROS:  Please see the history of present illness.   Otherwise, review of systems is negative.    PHYSICAL EXAM: VS:  BP 120/74 (BP Location: Left Arm, Patient Position: Sitting, Cuff Size: Normal)   Pulse 74   Ht 5' 4.75" (1.645 m)   Wt 265 lb 8 oz (120.4 kg)   LMP 09/16/2015   BMI 44.52 kg/m  , BMI Body mass index is 44.52 kg/m. GEN: Well nourished, well developed, in no acute distress  HEENT: normal  Neck: JVD flat, carotid bruits, or  masses Cardiac: R RR;  no murmur  rubs,noS4  Respiratory:  clear to auscultation bilaterally, normal work of breathing Back without kyphosis or CVAT GI: soft, nontender, nondistended, + BS MS: no deformity or atrophy  Skin: warm and dry,   Extremities No Clubbing cyanosis  Edema Neuro:  Strength and sensation are intact Psych: euthymic mood, full affect  EKG:  EKG is ordered today. The ekg ordered today shows NSR with QTx 390   Recent Labs: 11/09/2015: ALT 24; TSH 1.280 12/11/2015: BUN 11; Creatinine, Ser 0.74; Hemoglobin 11.2; Platelets 175; Potassium 4.1; Sodium 138    Lipid Panel     Component Value Date/Time   CHOL 219 (H) 06/11/2011 1400   TRIG 91.0 06/11/2011 1400   HDL 51.40  06/11/2011 1400   CHOLHDL 4 06/11/2011 1400   VLDL 18.2 06/11/2011 1400   LDLDIRECT 164.4 06/11/2011 1400     Wt Readings from Last 3 Encounters:  02/29/16 265 lb 8 oz (120.4 kg)  02/05/16 269 lb 2 oz (122.1 kg)  01/31/16 268 lb 9 oz (121.8 kg)      Other studies Reviewed: Additional studies/ records that were reviewed today include: As above   Review of the above records today demonstrates:     ASSESSMENT AND PLAN:  Dysautonomia/orthostatic intolerance  ? Diagnosis long QT in a daughter  Exertional chest discomfort dyspnea  Depression   Morbidly obese   We have discussed the importance of followup of genetic testing We will refill her BB which has kept her dysautonomic symptoms at [place]  Depression is better.  We discussed the importance of weight loss discussed low carbohydrate  diet and exercise.  She is also to see gynecology because of menorrhagia. Addressing this will help with her lightheadedness.   Current medicines are reviewed at length with the patient today.   The patient does not have concerns regarding her medicines.  The following changes were made today:  none  Labs/ tests ordered today include:   Orders Placed This Encounter  Procedures  . EKG 12-Lead     Disposition:   FU with me 1 year(s)   Signed, Virl Axe, MD  02/29/2016 10:29 AM     [facility] [address] [place] [address] [phone] (office) [phone] (fax)

## 2016-02-29 NOTE — Patient Instructions (Signed)

## 2016-03-06 ENCOUNTER — Ambulatory Visit (INDEPENDENT_AMBULATORY_CARE_PROVIDER_SITE_OTHER): Payer: 59 | Admitting: Obstetrics and Gynecology

## 2016-03-06 ENCOUNTER — Encounter: Payer: Self-pay | Admitting: Obstetrics and Gynecology

## 2016-03-06 VITALS — BP 109/70 | HR 98 | Ht 64.0 in | Wt 264.4 lb

## 2016-03-06 DIAGNOSIS — R1032 Left lower quadrant pain: Secondary | ICD-10-CM

## 2016-03-06 DIAGNOSIS — Z86718 Personal history of other venous thrombosis and embolism: Secondary | ICD-10-CM | POA: Diagnosis not present

## 2016-03-06 DIAGNOSIS — Z9889 Other specified postprocedural states: Secondary | ICD-10-CM | POA: Diagnosis not present

## 2016-03-06 DIAGNOSIS — N921 Excessive and frequent menstruation with irregular cycle: Secondary | ICD-10-CM

## 2016-03-06 DIAGNOSIS — Z6841 Body Mass Index (BMI) 40.0 and over, adult: Secondary | ICD-10-CM

## 2016-03-06 NOTE — Progress Notes (Signed)
GYNECOLOGY PROGRESS NOTE  Subjective:    Patient ID: Maria Moore, female    DOB: [birth year], 45 y.o.   MRN: [id number]  HPI  Patient is a 45 y.o. [id number] female with PMH of morbid obesity and DVT (remote) who presents for discussion of surgical management for abnormal uterine bleeding. Currently on progesterone OCPs. Patient with remote h/o endometrial ablation ~ 7 years ago, began noting heavy persistent heavy bleeding ~ bleeding.  Had a D&C performed for bleeding in May 2017 with benign pathology.  Notes that bleeding stopped initially, however after approximately 1 month she began noting daily light bleeding.  Bleeding has now increased to alternating between light and moderate bleeding almost daily. Denies dizziness, headaches, fatigue, SOB, or chest pain.   The following portions of the patient's history were reviewed and updated as appropriate: allergies, current medications, past family history, past medical history, past social history, past surgical history and problem list.  Review of Systems A comprehensive review of systems was negative except for: Genitourinary: positive for intermittent LLQ pain, non-radiating, feels dull and achy   Objective:   Blood pressure 109/70, pulse 98, height 5\' 4"  (1.626 m), weight 264 lb 6.4 oz (119.9 kg), last menstrual period 02/02/2016. Body mass index is 45.38 kg/m.  General appearance: alert, no distress and morbidly obese Abdomen: soft, non-tender; bowel sounds normal; no masses,  no organomegaly Pelvic: .external genitalia normal, rectovaginal septum normal.  Vagina without discharge.  Cervix normal appearing, no lesions and no motion tenderness.  Uterus mobile, nontender, normal shape and size. Small amount of uterine descensus.  Adnexae non-palpable, nontender bilaterally.  Extremities: extremities normal, atraumatic, no cyanosis or edema Neurologic: Grossly normal   Assessment:   Menorrhagia Morbid obesity H/o DVT (x 2) H/o  endometrial ablation Intermittent LLQ pain  Plan:   - Discussed management options again for abnormal uterine bleeding including tranexamic acid (Lysteda), oral progesterone, Depo Provera, Mirena IUD, endometrial ablation (Novasure/Hydrothermal Ablation) or hysterectomy as definitive surgical management.  Discussed risks and benefits of each method.   Patient desires definitive management with hysterectomy.  Based on today's exam, would recommend robotic TLH with bilateral salpingectomy.  Discussion had with patient regarding preservation of ovaries vs removal at time of surgery.  As patient reports intermittent h/o left-sided pelvic pain, discussed possibility of removing only left ovary.  Notes that she desires to leave both ovaries, unless disease process present.  Surgery to be scheduled for 04/15/2016.  - The risks of surgery were discussed in detail with the patient including but not limited to: bleeding which may require transfusion or reoperation; infection which may require prolonged hospitalization or re-hospitalization and antibiotic therapy; injury to bowel, bladder, ureters and major vessels or other surrounding organs; need for additional procedures including laparotomy; thromboembolic phenomenon, incisional problems and other postoperative or anesthesia complications.  Patient was told that the likelihood that her condition and symptoms will be treated effectively with this surgical management was very high; the postoperative expectations were also discussed in detail. The patient also understands the alternative treatment options which were discussed in full. All questions were answered.  She was told that she will be contacted by our surgical scheduler regarding the time and date of her surgery; routine preoperative instructions of having nothing to eat or drink after midnight on the day prior to surgery and also coming to the hospital 1.5 hours prior to her time of surgery were also  emphasized.  She was told she may be called for a  preoperative appointment about a week prior to surgery and will be given further preoperative instructions at that visit. Printed patient education handouts about the procedure were given to the patient to review at home.  Provera prescribed as needed for now,  bleeding precautions reviewed.    A total of 25 minutes were spent face-to-face with the patient during this encounter and over half of that time dealt with counseling and coordination of care.   Rubie Maid, MD [facility]

## 2016-03-06 NOTE — Patient Instructions (Signed)
You are scheduled for surgery on 04/15/2016  Nothing to eat after midnight on day prior to surgery.  Do not take any medications unless recommended by your provider on day prior to surgery.  Do not take NSAIDs (Motrin, Aleve) or aspirin 7 days prior to surgery.  You may take Tylenol products for minor aches and pains.  You will receive a prescription for pain medications post-operatively.  You will be contacted by phone approximately 1 week prior to surgery to schedule pre-operative appointment.  Please call the office if you have any questions regarding your upcoming surgery.

## 2016-03-10 MED ORDER — MEDROXYPROGESTERONE ACETATE 10 MG PO TABS: 10.0000 mg | ORAL_TABLET | Freq: Every day | ORAL | 2 refills | Status: DC

## 2016-03-10 NOTE — H&P (Signed)
GYNECOLOGY PRE-OPERATIVE  HISTORY AND PHYSICAL  Subjective:    Patient is a 45 y.o. [id number] female scheduled for robotic total laparoscopic hysterectomy with bilateral salpingectomy and possible left oophorectomy. Indications for procedure are abnormal uterine bleeding, intermittent left-sided pelvic pain.   Pertinent Gynecological History: Patient's last menstrual period was 02/02/2016 (approximate). Menses: flow is irregular and moderate.   Bleeding: dysfunctional uterine bleeding Contraception: tubal ligation Last mammogram: normal Date: 07/2013 Last pap: normal Date: 2013/05/19  Discussed Blood/Blood Products: yes    OB History  Gravida Para Term Preterm AB Living  5         5  SAB TAB Ectopic Multiple Live Births          5    # Outcome Date GA Lbr Len/2nd Weight Sex Delivery Anes PTL Lv  5 Gravida 2010    M Vag-Spont   LIV  4 Gravida 2006    M Vag-Spont   LIV  3 Gravida 2002    F Vag-Spont   LIV  2 Gravida 1997    F Vag-Spont   LIV  1 Gravida 1993    F Vag-Spont   LIV      Past Medical History:  Diagnosis Date  . Anemia   . Depression   . Palpitations   . Plantar fasciitis   . VTE (venous thromboembolism)    x 2. 1st was post partum, 2nd was post procedure, Protein C deficiency ruled out    Family History  Problem Relation Age of Onset  . COPD Mother   . Cancer Mother   . Early death Mother 46    Died at [facility] 2011/05/20  . Hypertension Father   . Kidney disease Father     renal calculi  . Heart disease Father 49    4 vessel CABG   . Cancer Maternal Grandmother     breast cancer  . Stroke Maternal Grandfather   . Mental illness Paternal Grandmother     Past Surgical History:  Procedure Laterality Date  . D&C (otheR)    . ENDOMETRIAL ABLATION    . extraction of wisdom teeth    . HYSTEROSCOPY W/D&C N/A 12/11/2015   Procedure: DILATATION AND CURETTAGE /HYSTEROSCOPY;  Surgeon: Rubie Maid, MD;  Location: [facility] ORS;  Service: Gynecology;   Laterality: N/A;  . LAPAROSCOPIC TUBAL LIGATION     [place]  . myringotomy and tubes (otheR)      Social History   Social History  . Marital status: Married    Spouse name: N/A  . Number of children: N/A  . Years of education: N/A   Social History Main Topics  . Smoking status: Never Smoker  . Smokeless tobacco: Never Used  . Alcohol use No  . Drug use: No  . Sexual activity: Yes    Birth control/ protection: None   Other Topics Concern  . 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     Current Outpatient Prescriptions on File Prior to Visit  Medication Sig Dispense Refill  . cholecalciferol (VITAMIN D) 1000 UNITS tablet Take 1,000 Units by mouth every other day.     . citalopram (CELEXA) 20 MG tablet TAKE 1 TABLET (20 MG TOTAL) BY MOUTH DAILY. 90 tablet 1  . metoprolol (LOPRESSOR) 50 MG tablet TAKE 1 TABLET (50 MG TOTAL) BY MOUTH 2 (TWO) TIMES DAILY. 180 tablet 3   No current  facility-administered medications on file prior to visit.     No Known Allergies  Review of Systems Constitutional: No recent fever/chills/sweats Respiratory: No recent cough/bronchitis Cardiovascular: No chest pain Gastrointestinal: No recent nausea/vomiting/diarrhea Genitourinary: No UTI symptoms Hematologic/lymphatic:No history of coagulopathy or recent blood thinner use    Objective:    BP 109/70   Pulse 98   Ht 5\' 4"  (1.626 m)   Wt 264 lb 6.4 oz (119.9 kg)   LMP 02/02/2016 (Approximate)   BMI 45.38 kg/m   General:   Normal  Skin:   normal  HEENT:  Normal  Neck:  Supple without Adenopathy or Thyromegaly  Lungs:   Heart:              Breasts:   Abdomen:  Pelvis:  M/S   Extremeties:  Neuro:    clear to auscultation bilaterally   Normal without murmur   Not Examined   soft, non-tender; bowel sounds normal; no masses,  no organomegaly   Exam deferred to OR  No CVAT  Warm/Dry     Normal          Assessment:    Menorrhagia Morbid obesity H/o DVT (x 2) H/o endometrial ablation LLQ pain (intermittent)   Plan:    Counseling: Procedure, risks, reasons, benefits and complications (including injury to bowel, bladder, major blood vessel, ureter, bleeding, possibility of transfusion, infection, or fistula formation) reviewed in detail. Consent signed. Preop testing ordered. Instructions reviewed, including NPO after midnight. DVT prophylaxis with SCDs as well as Lovenox.  Surgery scheduled for 04/15/16   Rubie Maid, MD [facility]

## 2016-03-17 ENCOUNTER — Other Ambulatory Visit: Payer: Self-pay | Admitting: Obstetrics and Gynecology

## 2016-03-29 DIAGNOSIS — N8003 Adenomyosis of the uterus: Secondary | ICD-10-CM

## 2016-03-29 DIAGNOSIS — N8000 Endometriosis of the uterus, unspecified: Secondary | ICD-10-CM

## 2016-03-29 HISTORY — DX: Endometriosis of the uterus, unspecified: N80.00

## 2016-03-29 HISTORY — DX: Endometriosis of uterus: N80.0

## 2016-03-29 HISTORY — DX: Adenomyosis of the uterus: N80.03

## 2016-03-30 ENCOUNTER — Emergency Department (HOSPITAL_COMMUNITY)
Admission: EM | Admit: 2016-03-30 | Discharge: 2016-03-30 | Disposition: A | Discharge status: Other Acute Inpatient Hospital | Payer: 59

## 2016-03-30 ENCOUNTER — Ambulatory Visit (INDEPENDENT_AMBULATORY_CARE_PROVIDER_SITE_OTHER): Payer: 59 | Admitting: Urgent Care

## 2016-03-30 ENCOUNTER — Telehealth: Payer: Self-pay | Admitting: Emergency Medicine

## 2016-03-30 ENCOUNTER — Ambulatory Visit (HOSPITAL_COMMUNITY)
Admission: RE | Admit: 2016-03-30 | Discharge: 2016-03-30 | Disposition: A | Discharge status: Home / Self Care | Payer: 59 | Source: Ambulatory Visit | Attending: Urgent Care | Admitting: Urgent Care

## 2016-03-30 VITALS — BP 116/70 | HR 95 | Temp 98.6°F | Resp 18 | Ht 64.0 in | Wt 269.0 lb

## 2016-03-30 DIAGNOSIS — M79661 Pain in right lower leg: Secondary | ICD-10-CM

## 2016-03-30 DIAGNOSIS — Z86718 Personal history of other venous thrombosis and embolism: Secondary | ICD-10-CM | POA: Insufficient documentation

## 2016-03-30 LAB — CBC
HCT: 34.6 % — ABNORMAL LOW (ref 35.0–45.0)
Hemoglobin: 10.9 g/dL — ABNORMAL LOW (ref 11.7–15.5)
MCH: 25.5 pg — ABNORMAL LOW (ref 27.0–33.0)
MCHC: 31.5 g/dL — ABNORMAL LOW (ref 32.0–36.0)
MCV: 80.8 fL (ref 80.0–100.0)
MPV: 9.4 fL (ref 7.5–12.5)
Platelets: 241 10*3/uL (ref 140–400)
RBC: 4.28 MIL/uL (ref 3.80–5.10)
RDW: 14.6 % (ref 11.0–15.0)
WBC: 9.3 10*3/uL (ref 3.8–10.8)

## 2016-03-30 LAB — SEDIMENTATION RATE: Sed Rate: 22 mm/hr — ABNORMAL HIGH (ref 0–20)

## 2016-03-30 NOTE — Patient Instructions (Addendum)
     IF you received an x-ray today, you will receive an invoice from [facility]. Please contact [facility] at [phone] with questions or concerns regarding your invoice.   IF you received labwork today, you will receive an invoice from [employer]. Please contact [facility] at [phone] with questions or concerns regarding your invoice.   Our billing staff will not be able to assist you with questions regarding bills from these companies.  You will be contacted with the lab results as soon as they are available. The fastest way to get your results is to activate your My Chart account. Instructions are located on the last page of this paperwork. If you have not heard from [place] regarding the results in 2 weeks, please contact this office.    Go directly to [facility] Registration in the ER

## 2016-03-30 NOTE — Progress Notes (Signed)
VASCULAR LAB PRELIMINARY  PRELIMINARY  PRELIMINARY  PRELIMINARY  Right lower extremity venous duplex completed.    Preliminary report:  There is no DVT, SVT, or Baker's cyst noted in the right lower extremity.   Mishon Blubaugh, RVT 03/30/2016, 1:47 PM

## 2016-03-30 NOTE — Telephone Encounter (Signed)
Report received from Vascular- No DVT

## 2016-03-30 NOTE — Progress Notes (Signed)
    MRN: [id number] DOB: [birth year]  Subjective:   Maria Moore is a 45 y.o. female presenting for chief complaint of Leg Pain (back of right leg since Monday- has hx of blood clots)  Reports 5 day history of right leg pain. Pain is constant, located over calf, worsened with movement, is like a throbbing type sensation, associated with numbness and tingling of the right foot. Denies fever, redness, warmth, swelling, trauma. Patient has sedentary work style. Patient had D&C in 11/2015 for menorrhagia. Patient is currently taking Provera for the same as prescribed by Dr. Derrel Nip. Denies smoking cigarettes. Has not traveled long distance or had surgeries recently.  Maria Moore has a current medication list which includes the following prescription(s): cholecalciferol, citalopram, medroxyprogesterone, and metoprolol. Also has No Known Allergies.  Maria Moore  has a past medical history of Anemia; Depression; Palpitations; Plantar fasciitis; and VTE (venous thromboembolism). Also  has a past surgical history that includes D&C (otheR); myringotomy and tubes (otheR); extraction of wisdom teeth; Laparoscopic tubal ligation; Tubal ligation; Endometrial ablation; and Hysteroscopy w/D&C (N/A, 12/11/2015).  Objective:   Vitals: BP 116/70   Pulse 95   Temp 98.6 F (37 C) (Oral)   Resp 18   Ht 5\' 4"  (1.626 m)   Wt 269 lb (122 kg)   LMP 02/02/2016 (Approximate)   SpO2 93%   BMI 46.17 kg/m   Physical Exam  Constitutional: She is oriented to person, place, and time. She appears well-developed and well-nourished.  Cardiovascular: Normal rate, regular rhythm and intact distal pulses.  Exam reveals no gallop and no friction rub.   No murmur heard. Pulmonary/Chest: No respiratory distress. She has no wheezes. She has no rales.  Musculoskeletal:       Right lower leg: She exhibits tenderness (lower right calf, positive Homann sign). She exhibits no bony tenderness, no swelling, no edema, no deformity and no  laceration.  Right and left calves measure 47cm. There is no erythema or warmth.  Neurological: She is alert and oriented to person, place, and time.   Assessment and Plan :   1. Pain in right lower leg 2. History of DVT (deep vein thrombosis) - Will order STAT U/S to r/o DVT. Stop Provera for now. U/S pending.  Jaynee Eagles, PA-C [facility] [phone] 03/30/2016 11:35 AM   UPDATE: Preliminary U/S results were negative for DVT, SVT, baker's cyst. I reported this to patient by VM and advised that she use APAP for pain and inflammation. Wrap with Ace wrap and elevate legs. Counseled on worsening signs and symptoms of DVT, she is to report to ED if this develops or rtc on 04/02/2016 for recheck.

## 2016-04-03 ENCOUNTER — Encounter: Payer: Self-pay | Admitting: Obstetrics and Gynecology

## 2016-04-03 ENCOUNTER — Ambulatory Visit (INDEPENDENT_AMBULATORY_CARE_PROVIDER_SITE_OTHER): Payer: 59 | Admitting: Obstetrics and Gynecology

## 2016-04-03 VITALS — BP 120/74 | HR 82 | Ht 64.0 in | Wt 269.6 lb

## 2016-04-03 DIAGNOSIS — Z9889 Other specified postprocedural states: Secondary | ICD-10-CM

## 2016-04-03 DIAGNOSIS — N920 Excessive and frequent menstruation with regular cycle: Secondary | ICD-10-CM

## 2016-04-03 DIAGNOSIS — Z86718 Personal history of other venous thrombosis and embolism: Secondary | ICD-10-CM

## 2016-04-03 NOTE — Progress Notes (Signed)
    GYNECOLOGY PROGRESS NOTE  Subjective:    Patient ID: ALDER TUBB, female    DOB: November 21, 1970, 45 y.o.   MRN: [id number]  HPI  Patient is a 45 y.o. [id number] female with PMH of morbid obesity and DVT (remote) who presents for pre-opeartive appointment for surgical management for abnormal uterine bleeding.  Currently scheduled for robotic TLH with bilateral salpingectomy.  Currently on progesterone OCPs. Patient with remote h/o endometrial ablation ~ 7 years ago, began noting heavy persistent heavy bleeding ~ bleeding.  Had a D&C performed for bleeding in May 2017 with benign pathology.  Notes that bleeding stopped initially, however after approximately 1 month she began noting daily light bleeding.  Bleeding has now increased to alternating between light and moderate bleeding almost daily. Denies dizziness, headaches, fatigue, SOB, or chest pain.   The following portions of the patient's history were reviewed and updated as appropriate: allergies, current medications, past family history, past medical history, past social history, past surgical history and problem list.  Review of Systems A comprehensive review of systems was negative except for: Genitourinary: positive for intermittent LLQ pain, non-radiating, feels dull and achy   Objective:   Blood pressure 120/74, pulse 82, height 5\' 4"  (1.626 m), weight 269 lb 9.6 oz (122.3 kg), last menstrual period 02/02/2016. Body mass index is 46.28 kg/m.  General appearance: alert, no distress and morbidly obese Exam deferred.    Assessment:   Menorrhagia Morbid obesity H/o DVT (x 2) H/o endometrial ablation Intermittent LLQ pain  Plan:   -  Patient desires definitive management with hysterectomy.  Discussion had with patient regarding preservation of ovaries vs removal at time of surgery.  As patient reports intermittent h/o left-sided pelvic pain, discussed possibility of removing only left ovary.  Notes that she desires to leave both  ovaries, unless disease process present.  Surgery to be scheduled for 04/15/2016.  - The risks of surgery were reiterated with the patient including but not limited to: bleeding which may require transfusion or reoperation; infection which may require prolonged hospitalization or re-hospitalization and antibiotic therapy; injury to bowel, bladder, ureters and major vessels or other surrounding organs; need for additional procedures including laparotomy; thromboembolic phenomenon, incisional problems and other postoperative or anesthesia complications.  Patient was told that the likelihood that her condition and symptoms will be treated effectively with this surgical management was very high; the postoperative expectations were also discussed in detail.   Scheduled for pre-operative interview on 04/09/16.  Patient will need DVT prophylaxis with heparin or Lovenox pre-op, and SCDs post-op.  A total of 15 minutes were spent face-to-face with the patient during this encounter and over half of that time dealt with counseling and coordination of care.   Rubie Maid, MD [facility]

## 2016-04-09 NOTE — Patient Instructions (Signed)
  Your procedure is scheduled on: 04-15-16 [facility]) Report to Same Day Surgery 2nd floor medical mall To find out your arrival time please call [phone] between 1PM - 3PM on 04-12-16 (FRIDAY)  Remember: Instructions that are not followed completely may result in serious medical risk, up to and including death, or upon the discretion of your surgeon and anesthesiologist your surgery may need to be rescheduled.    _x___ 1. Do not eat food or drink liquids after midnight. No gum chewing or hard candies.     __x__ 2. No Alcohol for 24 hours before or after surgery.   __x__3. No Smoking for 24 prior to surgery.   ____  4. Bring all medications with you on the day of surgery if instructed.    __x__ 5. Notify your doctor if there is any change in your medical condition     (cold, fever, infections).     Do not wear jewelry, make-up, hairpins, clips or nail polish.  Do not wear lotions, powders, or perfumes. You may wear deodorant.  Do not shave 48 hours prior to surgery. Men may shave face and neck.  Do not bring valuables to the hospital.    [facility] is not responsible for any belongings or valuables.               Contacts, dentures or bridgework may not be worn into surgery.  Leave your suitcase in the car. After surgery it may be brought to your room.  For patients admitted to the hospital, discharge time is determined by your treatment team.   Patients discharged the day of surgery will not be allowed to drive home.    Please read over the following fact sheets that you were given:   [facility] Preparing for Surgery and or MRSA Information   _x___ Take these medicines the morning of surgery with A SIP OF WATER:    1. METOPROLOL  2. CELEXA  3. PRILOSEC  4. TAKE A PRILOSEC Sunday NIGHT BEFORE BED  5.  6.  ____ Fleet Enema (as directed)   _x___ Use CHG Soap or sage wipes as directed on instruction sheet   ____ Use inhalers on the day of surgery and bring to hospital  day of surgery  ____ Stop metformin 2 days prior to surgery    ____ Take 1/2 of usual insulin dose the night before surgery and none on the morning of surgery.   ____ Stop aspirin or coumadin, or plavix  _x__ Stop Anti-inflammatories such as Advil, Aleve, Ibuprofen, Motrin, Naproxen,          Naprosyn, Goodies powders or aspirin products. Ok to take Tylenol.   ____ Stop supplements until after surgery.    ____ Bring C-Pap to the hospital.

## 2016-04-10 ENCOUNTER — Encounter
Admission: RE | Admit: 2016-04-10 | Discharge: 2016-04-10 | Disposition: A | Discharge status: Home / Self Care | Payer: 59 | Source: Ambulatory Visit | Attending: Obstetrics and Gynecology | Admitting: Obstetrics and Gynecology

## 2016-04-10 DIAGNOSIS — Z01812 Encounter for preprocedural laboratory examination: Secondary | ICD-10-CM | POA: Diagnosis not present

## 2016-04-10 HISTORY — DX: Familial dysautonomia (riley-day): G90.1

## 2016-04-10 HISTORY — DX: Family history of other specified conditions: Z84.89

## 2016-04-10 LAB — BASIC METABOLIC PANEL
Anion gap: 9 (ref 5–15)
BUN: 14 mg/dL (ref 6–20)
CO2: 24 mmol/L (ref 22–32)
Calcium: 9.3 mg/dL (ref 8.9–10.3)
Chloride: 105 mmol/L (ref 101–111)
Creatinine, Ser: 0.68 mg/dL (ref 0.44–1.00)
GFR calc Af Amer: >60 mL/min (ref >60)
GFR calc non Af Amer: >60 mL/min (ref >60)
Glucose, Bld: 85 mg/dL (ref 65–99)
Potassium: 3.8 mmol/L (ref 3.5–5.1)
Sodium: 138 mmol/L (ref 135–145)

## 2016-04-10 LAB — CBC
HCT: 34.8 % — ABNORMAL LOW (ref 35.0–47.0)
Hemoglobin: 11.7 g/dL — ABNORMAL LOW (ref 12.0–16.0)
MCH: 26.3 pg (ref 26.0–34.0)
MCHC: 33.5 g/dL (ref 32.0–36.0)
MCV: 78.6 fL — ABNORMAL LOW (ref 80.0–100.0)
Platelets: 228 10*3/uL (ref 150–440)
RBC: 4.43 MIL/uL (ref 3.80–5.20)
RDW: 15.8 % — ABNORMAL HIGH (ref 11.5–14.5)
WBC: 7.4 10*3/uL (ref 3.6–11.0)

## 2016-04-10 LAB — TYPE AND SCREEN
ABO/RH(D): A NEG
Antibody Screen: NEGATIVE

## 2016-04-10 LAB — RAPID HIV SCREEN (HIV 1/2 AB+AG)
HIV 1/2 Antibodies: NONREACTIVE
HIV-1 P24 Antigen - HIV24: NONREACTIVE

## 2016-04-11 LAB — HEPATITIS C ANTIBODY: HCV Ab: <0.1 s/co ratio (ref 0.0–0.9)

## 2016-04-11 LAB — RPR: RPR Ser Ql: NONREACTIVE

## 2016-04-15 ENCOUNTER — Ambulatory Visit: Payer: 59 | Admitting: Anesthesiology

## 2016-04-15 ENCOUNTER — Encounter
Admission: AD | Disposition: A | Discharge status: Home / Self Care | Payer: Self-pay | Source: Ambulatory Visit | Attending: Obstetrics and Gynecology

## 2016-04-15 ENCOUNTER — Observation Stay
Admission: AD | Admit: 2016-04-15 | Discharge: 2016-04-16 | Disposition: A | Discharge status: Home / Self Care | Payer: 59 | Source: Ambulatory Visit | Attending: Obstetrics and Gynecology | Admitting: Obstetrics and Gynecology

## 2016-04-15 ENCOUNTER — Encounter: Payer: Self-pay | Admitting: *Deleted

## 2016-04-15 DIAGNOSIS — G8929 Other chronic pain: Secondary | ICD-10-CM | POA: Diagnosis present

## 2016-04-15 DIAGNOSIS — N800 Endometriosis of uterus: Secondary | ICD-10-CM | POA: Insufficient documentation

## 2016-04-15 DIAGNOSIS — Z818 Family history of other mental and behavioral disorders: Secondary | ICD-10-CM | POA: Insufficient documentation

## 2016-04-15 DIAGNOSIS — R102 Pelvic and perineal pain: Secondary | ICD-10-CM | POA: Insufficient documentation

## 2016-04-15 DIAGNOSIS — D251 Intramural leiomyoma of uterus: Secondary | ICD-10-CM | POA: Insufficient documentation

## 2016-04-15 DIAGNOSIS — Z841 Family history of disorders of kidney and ureter: Secondary | ICD-10-CM | POA: Diagnosis not present

## 2016-04-15 DIAGNOSIS — N920 Excessive and frequent menstruation with regular cycle: Secondary | ICD-10-CM

## 2016-04-15 DIAGNOSIS — N83201 Unspecified ovarian cyst, right side: Secondary | ICD-10-CM | POA: Diagnosis not present

## 2016-04-15 DIAGNOSIS — Z825 Family history of asthma and other chronic lower respiratory diseases: Secondary | ICD-10-CM | POA: Insufficient documentation

## 2016-04-15 DIAGNOSIS — Z86718 Personal history of other venous thrombosis and embolism: Secondary | ICD-10-CM | POA: Insufficient documentation

## 2016-04-15 DIAGNOSIS — Z8249 Family history of ischemic heart disease and other diseases of the circulatory system: Secondary | ICD-10-CM | POA: Diagnosis not present

## 2016-04-15 DIAGNOSIS — Z79899 Other long term (current) drug therapy: Secondary | ICD-10-CM | POA: Insufficient documentation

## 2016-04-15 DIAGNOSIS — Z9889 Other specified postprocedural states: Secondary | ICD-10-CM | POA: Insufficient documentation

## 2016-04-15 DIAGNOSIS — Z823 Family history of stroke: Secondary | ICD-10-CM | POA: Insufficient documentation

## 2016-04-15 DIAGNOSIS — F329 Major depressive disorder, single episode, unspecified: Secondary | ICD-10-CM | POA: Diagnosis not present

## 2016-04-15 DIAGNOSIS — Z6841 Body Mass Index (BMI) 40.0 and over, adult: Secondary | ICD-10-CM | POA: Diagnosis not present

## 2016-04-15 DIAGNOSIS — N888 Other specified noninflammatory disorders of cervix uteri: Secondary | ICD-10-CM | POA: Diagnosis not present

## 2016-04-15 DIAGNOSIS — N938 Other specified abnormal uterine and vaginal bleeding: Secondary | ICD-10-CM | POA: Diagnosis not present

## 2016-04-15 DIAGNOSIS — N939 Abnormal uterine and vaginal bleeding, unspecified: Secondary | ICD-10-CM | POA: Diagnosis present

## 2016-04-15 HISTORY — PX: ROBOTIC ASSISTED LAP VAGINAL HYSTERECTOMY: SHX2362

## 2016-04-15 LAB — ABO/RH: ABO/RH(D): A NEG

## 2016-04-15 SURGERY — ROBOTIC ASSISTED LAPAROSCOPIC VAGINAL HYSTERECTOMY
Anesthesia: General | Wound class: Clean Contaminated

## 2016-04-15 MED ORDER — FENTANYL CITRATE (PF) 100 MCG/2ML IJ SOLN
INTRAMUSCULAR | Status: AC
  Filled 2016-04-15: qty 2

## 2016-04-15 MED ORDER — MAGNESIUM CITRATE PO SOLN
1.0000 | Freq: Once | ORAL | Status: DC | PRN
  Filled 2016-04-15: qty 296

## 2016-04-15 MED ORDER — LACTATED RINGERS IV SOLN
INTRAVENOUS | Status: DC
  Administered 2016-04-15 – 2016-04-16 (×2): via INTRAVENOUS

## 2016-04-15 MED ORDER — SODIUM CHLORIDE 0.9 % IR SOLN
Status: DC | PRN
  Administered 2016-04-15: 1

## 2016-04-15 MED ORDER — ONDANSETRON HCL 4 MG/2ML IJ SOLN: 4.0000 mg | Freq: Four times a day (QID) | INTRAMUSCULAR | Status: DC | PRN

## 2016-04-15 MED ORDER — LACTATED RINGERS IV SOLN
INTRAVENOUS | Status: DC
  Administered 2016-04-15 (×3): via INTRAVENOUS

## 2016-04-15 MED ORDER — BUPIVACAINE HCL 0.5 % IJ SOLN
INTRAMUSCULAR | Status: DC | PRN
  Administered 2016-04-15: 17 mL

## 2016-04-15 MED ORDER — ACETAMINOPHEN 10 MG/ML IV SOLN
INTRAVENOUS | Status: DC | PRN
  Administered 2016-04-15: 1000 mg via INTRAVENOUS

## 2016-04-15 MED ORDER — ROCURONIUM BROMIDE 100 MG/10ML IV SOLN
INTRAVENOUS | Status: DC | PRN
  Administered 2016-04-15 (×4): 10 mg via INTRAVENOUS
  Administered 2016-04-15: 20 mg via INTRAVENOUS
  Administered 2016-04-15: 50 mg via INTRAVENOUS
  Administered 2016-04-15 (×2): 10 mg via INTRAVENOUS

## 2016-04-15 MED ORDER — PANTOPRAZOLE SODIUM 40 MG PO TBEC
40.0000 mg | DELAYED_RELEASE_TABLET | Freq: Every day | ORAL | Status: DC
  Administered 2016-04-16: 40 mg via ORAL
  Filled 2016-04-15: qty 1

## 2016-04-15 MED ORDER — CEFAZOLIN SODIUM-DEXTROSE 2-4 GM/100ML-% IV SOLN
INTRAVENOUS | Status: AC
  Filled 2016-04-15: qty 100

## 2016-04-15 MED ORDER — KETOROLAC TROMETHAMINE 30 MG/ML IJ SOLN
30.0000 mg | Freq: Once | INTRAMUSCULAR | Status: AC
  Administered 2016-04-15: 30 mg via INTRAVENOUS

## 2016-04-15 MED ORDER — ONDANSETRON HCL 4 MG PO TABS: 4.0000 mg | ORAL_TABLET | Freq: Four times a day (QID) | ORAL | Status: DC | PRN

## 2016-04-15 MED ORDER — SIMETHICONE 80 MG PO CHEW: 80.0000 mg | CHEWABLE_TABLET | Freq: Four times a day (QID) | ORAL | Status: DC | PRN

## 2016-04-15 MED ORDER — OXYCODONE-ACETAMINOPHEN 5-325 MG PO TABS
1.0000 | ORAL_TABLET | ORAL | Status: DC | PRN
  Administered 2016-04-16: 2 via ORAL
  Filled 2016-04-15: qty 2

## 2016-04-15 MED ORDER — BISACODYL 10 MG RE SUPP: 10.0000 mg | Freq: Every day | RECTAL | Status: DC | PRN

## 2016-04-15 MED ORDER — KETOROLAC TROMETHAMINE 30 MG/ML IJ SOLN
INTRAMUSCULAR | Status: AC
Start: 2016-04-15 — End: 2016-04-16
  Filled 2016-04-15: qty 1

## 2016-04-15 MED ORDER — FLUORESCEIN SODIUM 10 % IV SOLN
INTRAVENOUS | Status: DC | PRN
  Administered 2016-04-15: .5 mL via INTRAVENOUS

## 2016-04-15 MED ORDER — ONDANSETRON HCL 4 MG/2ML IJ SOLN
INTRAMUSCULAR | Status: DC | PRN
Start: 2016-04-15 — End: 2016-04-15
  Administered 2016-04-15: 4 mg via INTRAVENOUS

## 2016-04-15 MED ORDER — MENTHOL 3 MG MT LOZG
1.0000 | LOZENGE | OROMUCOSAL | Status: DC | PRN
  Filled 2016-04-15: qty 9

## 2016-04-15 MED ORDER — ACETAMINOPHEN 10 MG/ML IV SOLN
INTRAVENOUS | Status: AC
  Filled 2016-04-15: qty 100

## 2016-04-15 MED ORDER — HYDROMORPHONE HCL 1 MG/ML IJ SOLN
0.2000 mg | INTRAMUSCULAR | Status: DC | PRN
  Administered 2016-04-15 – 2016-04-16 (×6): 0.6 mg via INTRAVENOUS
  Filled 2016-04-15 (×6): qty 1

## 2016-04-15 MED ORDER — SENNOSIDES-DOCUSATE SODIUM 8.6-50 MG PO TABS: 1.0000 | ORAL_TABLET | Freq: Every evening | ORAL | Status: DC | PRN

## 2016-04-15 MED ORDER — CITALOPRAM HYDROBROMIDE 20 MG PO TABS
20.0000 mg | ORAL_TABLET | Freq: Every day | ORAL | Status: DC
  Administered 2016-04-16: 20 mg via ORAL
  Filled 2016-04-15: qty 1

## 2016-04-15 MED ORDER — LACTATED RINGERS IV SOLN
INTRAVENOUS | Status: DC
  Administered 2016-04-15 (×2): via INTRAVENOUS

## 2016-04-15 MED ORDER — PHENYLEPHRINE HCL 10 MG/ML IJ SOLN
INTRAMUSCULAR | Status: DC | PRN
  Administered 2016-04-15: 100 ug via INTRAVENOUS

## 2016-04-15 MED ORDER — LIDOCAINE HCL (CARDIAC) 20 MG/ML IV SOLN
INTRAVENOUS | Status: DC | PRN
  Administered 2016-04-15: 100 mg via INTRAVENOUS

## 2016-04-15 MED ORDER — ENOXAPARIN SODIUM 40 MG/0.4ML ~~LOC~~ SOLN
40.0000 mg | SUBCUTANEOUS | Status: AC
  Administered 2016-04-15: 40 mg via SUBCUTANEOUS
  Filled 2016-04-15: qty 0.4

## 2016-04-15 MED ORDER — SUGAMMADEX SODIUM 500 MG/5ML IV SOLN
INTRAVENOUS | Status: DC | PRN
  Administered 2016-04-15: 243.2 mg via INTRAVENOUS

## 2016-04-15 MED ORDER — IBUPROFEN 600 MG PO TABS
600.0000 mg | ORAL_TABLET | Freq: Four times a day (QID) | ORAL | Status: DC | PRN
  Administered 2016-04-16: 600 mg via ORAL
  Filled 2016-04-15: qty 1

## 2016-04-15 MED ORDER — MIDAZOLAM HCL 2 MG/2ML IJ SOLN
INTRAMUSCULAR | Status: DC | PRN
  Administered 2016-04-15: 2 mg via INTRAVENOUS

## 2016-04-15 MED ORDER — ZOLPIDEM TARTRATE 5 MG PO TABS: 5.0000 mg | ORAL_TABLET | Freq: Every evening | ORAL | Status: DC | PRN

## 2016-04-15 MED ORDER — LIDOCAINE HCL (PF) 1 % IJ SOLN
INTRAMUSCULAR | Status: AC
  Filled 2016-04-15: qty 30

## 2016-04-15 MED ORDER — METOPROLOL TARTRATE 50 MG PO TABS
50.0000 mg | ORAL_TABLET | Freq: Two times a day (BID) | ORAL | Status: DC
  Administered 2016-04-15 – 2016-04-16 (×2): 50 mg via ORAL
  Filled 2016-04-15 (×3): qty 1

## 2016-04-15 MED ORDER — ONDANSETRON HCL 4 MG/2ML IJ SOLN
4.0000 mg | Freq: Once | INTRAMUSCULAR | Status: DC | PRN
Start: 2016-04-15 — End: 2016-04-15

## 2016-04-15 MED ORDER — PROPOFOL 10 MG/ML IV BOLUS
INTRAVENOUS | Status: DC | PRN
  Administered 2016-04-15: 200 mg via INTRAVENOUS

## 2016-04-15 MED ORDER — FENTANYL CITRATE (PF) 100 MCG/2ML IJ SOLN
25.0000 ug | INTRAMUSCULAR | Status: AC | PRN
  Administered 2016-04-15 (×6): 25 ug via INTRAVENOUS

## 2016-04-15 MED ORDER — DOCUSATE SODIUM 100 MG PO CAPS
100.0000 mg | ORAL_CAPSULE | Freq: Two times a day (BID) | ORAL | Status: DC
  Administered 2016-04-16: 100 mg via ORAL
  Filled 2016-04-15 (×2): qty 1

## 2016-04-15 MED ORDER — ALUM & MAG HYDROXIDE-SIMETH 200-200-20 MG/5ML PO SUSP: 30.0000 mL | ORAL | Status: DC | PRN

## 2016-04-15 MED ORDER — FLUORESCEIN SODIUM 10 % IV SOLN
INTRAVENOUS | Status: AC
  Filled 2016-04-15: qty 5

## 2016-04-15 MED ORDER — METHYLENE BLUE 0.5 % INJ SOLN
INTRAVENOUS | Status: AC
  Filled 2016-04-15: qty 10

## 2016-04-15 MED ORDER — CEFAZOLIN SODIUM-DEXTROSE 2-4 GM/100ML-% IV SOLN
2.0000 g | INTRAVENOUS | Status: AC
  Administered 2016-04-15: 2 g via INTRAVENOUS

## 2016-04-15 MED ORDER — BUPIVACAINE HCL (PF) 0.5 % IJ SOLN
INTRAMUSCULAR | Status: AC
  Filled 2016-04-15: qty 30

## 2016-04-15 MED ORDER — FENTANYL CITRATE (PF) 100 MCG/2ML IJ SOLN
INTRAMUSCULAR | Status: DC | PRN
Start: 2016-04-15 — End: 2016-04-15
  Administered 2016-04-15: 100 ug via INTRAVENOUS
  Administered 2016-04-15: 25 ug via INTRAVENOUS
  Administered 2016-04-15: 50 ug via INTRAVENOUS
  Administered 2016-04-15: 25 ug via INTRAVENOUS
  Administered 2016-04-15 (×2): 100 ug via INTRAVENOUS

## 2016-04-15 SURGICAL SUPPLY — 64 items
APL SRG 38 LTWT LNG FL B (MISCELLANEOUS) ×1
APPLICATOR ARISTA FLEXITIP XL (MISCELLANEOUS) ×1 IMPLANT
BAG URO DRAIN 2000ML W/SPOUT (MISCELLANEOUS) ×2 IMPLANT
BLADE CLIPPER SPEC (BLADE) ×1 IMPLANT
BLADE SURG SZ11 CARB STEEL (BLADE) ×2 IMPLANT
CANISTER SUCT 1200ML W/VALVE (MISCELLANEOUS) ×2 IMPLANT
CANNULA SEALS 8.5MM (CANNULA) ×1
CATH FOLEY 2WAY  5CC 16FR (CATHETERS) ×1
CATH FOLEY 2WAY 5CC 16FR (CATHETERS) ×1
CATH URTH 16FR FL 2W BLN LF (CATHETERS) ×1 IMPLANT
CHLORAPREP W/TINT 26ML (MISCELLANEOUS) ×2 IMPLANT
CORD BIP STRL DISP 12FT (MISCELLANEOUS) ×2 IMPLANT
CORD MONOPOLAR M/FML 12FT (MISCELLANEOUS) ×2 IMPLANT
COVER TIP SHEARS 8 DVNC (MISCELLANEOUS) ×1 IMPLANT
COVER TIP SHEARS 8MM DA VINCI (MISCELLANEOUS) ×1
DEFOGGER SCOPE WARMER CLEARIFY (MISCELLANEOUS) ×2 IMPLANT
DRAPE 3 ARM ACCESS DA VINCI (DRAPES) ×1
DRAPE 3 ARM ACCESS DVNC (DRAPES) ×1 IMPLANT
DRAPE SHEET LG 3/4 BI-LAMINATE (DRAPES) ×4 IMPLANT
ELECT REM PT RETURN 9FT ADLT (ELECTROSURGICAL) ×2
ELECTRODE REM PT RTRN 9FT ADLT (ELECTROSURGICAL) ×1 IMPLANT
FILTER LAP SMOKE EVAC STRL (MISCELLANEOUS) ×2 IMPLANT
GLOVE BIO SURGEON STRL SZ 6.5 (GLOVE) ×8 IMPLANT
GLOVE INDICATOR 7.0 STRL GRN (GLOVE) ×8 IMPLANT
GOWN STRL REUS W/ TWL LRG LVL3 (GOWN DISPOSABLE) ×8 IMPLANT
GOWN STRL REUS W/TWL LRG LVL3 (GOWN DISPOSABLE) ×16
GRASPER SUT TROCAR 14GX15 (MISCELLANEOUS) ×2 IMPLANT
HEMOSTAT ARISTA ABSORB 3G PWDR (MISCELLANEOUS) ×1 IMPLANT
HOLDER FOLEY CATH W/STRAP (MISCELLANEOUS) ×1 IMPLANT
IRRIGATION STRYKERFLOW (MISCELLANEOUS) ×1 IMPLANT
IRRIGATOR STRYKERFLOW (MISCELLANEOUS) ×2
IV NS 1000ML (IV SOLUTION) ×4
IV NS 1000ML BAXH (IV SOLUTION) ×1 IMPLANT
KIT PINK PAD W/HEAD ARE REST (MISCELLANEOUS) ×2
KIT PINK PAD W/HEAD ARM REST (MISCELLANEOUS) ×1 IMPLANT
LABEL OR SOLS (LABEL) ×2 IMPLANT
LIQUID BAND (GAUZE/BANDAGES/DRESSINGS) ×2 IMPLANT
MANIPULATOR VCARE LG CRV RETR (MISCELLANEOUS) IMPLANT
MANIPULATOR VCARE SML CRV RETR (MISCELLANEOUS) IMPLANT
MANIPULATOR VCARE STD CRV RETR (MISCELLANEOUS) ×1 IMPLANT
NEEDLE VERESS 14GA 120MM (NEEDLE) ×2 IMPLANT
NS IRRIG 1000ML POUR BTL (IV SOLUTION) ×2 IMPLANT
OCCLUDER COLPOPNEUMO (BALLOONS) ×2 IMPLANT
PACK GYN LAPAROSCOPIC (MISCELLANEOUS) ×2 IMPLANT
PAD OB MATERNITY 4.3X12.25 (PERSONAL CARE ITEMS) ×2 IMPLANT
PAD PREP 24X41 OB/GYN DISP (PERSONAL CARE ITEMS) ×2 IMPLANT
SCISSORS METZENBAUM CVD 33 (INSTRUMENTS) ×2 IMPLANT
SEAL CANN 8.5 DVNC (CANNULA) ×1 IMPLANT
SET CYSTO W/LG BORE CLAMP LF (SET/KITS/TRAYS/PACK) ×2 IMPLANT
SOLUTION ELECTROLUBE (MISCELLANEOUS) ×2 IMPLANT
SUT DVC VLOC 180 0 12IN GS21 (SUTURE) ×2
SUT MNCRL 4-0 (SUTURE) ×2
SUT MNCRL 4-0 27XMFL (SUTURE) ×1
SUT VIC AB 2-0 CT1 27 (SUTURE) ×2
SUT VIC AB 2-0 CT1 TAPERPNT 27 (SUTURE) ×1 IMPLANT
SUT VICRYL 0 AB UR-6 (SUTURE) ×2 IMPLANT
SUTURE DVC VLC 180 0 12IN GS21 (SUTURE) ×1 IMPLANT
SUTURE MNCRL 4-0 27XMF (SUTURE) ×1 IMPLANT
SYR 50ML LL SCALE MARK (SYRINGE) ×2 IMPLANT
SYRINGE 10CC LL (SYRINGE) ×2 IMPLANT
TROCAR 12M 150ML BLUNT (TROCAR) ×1 IMPLANT
TROCAR ENDO BLADELESS 11MM (ENDOMECHANICALS) ×2 IMPLANT
TROCAR XCEL 12X100 BLDLESS (ENDOMECHANICALS) ×2 IMPLANT
TUBING INSUFFLATOR HEATED (MISCELLANEOUS) ×2 IMPLANT

## 2016-04-15 NOTE — Transfer of Care (Signed)
Immediate Anesthesia Transfer of Care Note  Patient: Maria Moore  Procedure(s) Performed: Procedure(s): ROBOTIC ASSISTED LAPAROSCOPIC TOTAL HYSTERECTOMY WITH BILATERAL SALPINGECTOMY AND CYSTOSCOPY (N/A)  Patient Location: PACU  Anesthesia Type:General  Level of Consciousness: awake  Airway & Oxygen Therapy: Patient Spontanous Breathing and Patient connected to face mask oxygen  Post-op Assessment: Report given to RN and Post -op Vital signs reviewed and stable  Post vital signs: Reviewed and stable  Last Vitals:  Vitals:   04/15/16 0830 04/15/16 1410  BP: (!) 113/92 (!) 144/72  Pulse: 79 92  Resp: 16 20  Temp: 36.9 C 36.4 C    Last Pain:  Vitals:   04/15/16 0830  TempSrc: Oral         Complications: No apparent anesthesia complications

## 2016-04-15 NOTE — Op Note (Signed)
Procedure(s): ROBOTIC ASSISTED LAPAROSCOPIC TOTAL HYSTERECTOMY WITH BILATERAL SALPINGECTOMY AND CYSTOSCOPY Procedure Note  Maria Moore female 45 y.o. 04/15/2016  Indications: The patient is a 45 y.o. y.o. [id number] female with a history of menorrhagia s/p endometrial ablation in 11/2015.    Pre-operative Diagnosis: 1) Menorrhagia, 2) Morbid obesity (BMI 46), 3) H/o DVT (x 2), 4) H/o endometrial ablation (in 11/2015), and 6) LLQ pain (intermittent)  Post-operative Diagnosis: Same, with small simple right ovarian cyst.   Operation: Total laparoscopic robotic hysterectomy with bilateral salpingectomy, and cystoscopy  Surgeon: Rubie Maid , MD  Assistants: Surgical scrub tech  Anesthesia: General endotracheal anesthesia   Findings: The uterus was sounded to 11 cm, appeared normal on laparoscopy.  Fallopian tubes and ovaries appeared normal, with exception of small simple cyst (~ 2-3 cm on right ovary, clear fluid) Normal appearing upper abdomen.   Procedure Details  The patient was seen in the Holding Room. The risks, benefits, complications, treatment options, and expected outcomes were discussed with the patient.  The patient concurred with the proposed plan, giving informed consent.  The site of surgery properly noted. The patient was taken to Operating Room # 4, identified as Maria Moore and the procedure verified as total robotic laparoscopic hysterectomy with bilateral salpingectomy. A Time Out was held and the above information confirmed.  After induction of anesthesia, the patient was prepped and draped in the usual sterile manner. Pt was placed in dorsal lithotomy position after anesthesia and draped and prepped in the usual sterile manner. Foley catheter was placed.  A sterile speculum was placed into the vagina.  The cervix was grasped with a single-tooth tenaculum and the uterus was sounded to 11 cm. The balloon manipulator was then properly placed. The balloon was  filled to approximately 3 cc of saline. The cervical cup was placed around the cervix. A vaginal  balloon occluder a lap pad was then placed inside the vagina to help with pneumoperitoneum.    Attention was then turned to the abdomen, where  an 8 mm incision was made supraumubilcally.  The Veress needle was passed and a pneumoperitoneum was established.  The Veress needle was then removed and an 8 mm port was placed supraumbilically.  The daVinci camera was then placed supraumbilically. Three more ports were then placed. There were two 8 mm ports that were placed 10 cm laterally to the umbilicus and 2 cm inferiorly on either side.  The 11 mm assistant port was then placed in the right lower quadrant 3 cm medial and superiorr to the iliac crest. All incisions were injected with local anesthetic (Sensorcaine 0.5%, total of 16 cc) prior to port placement. The daVinci robot was then docked in the normal fashion. The patient was placed in steep Trendelenburg positioning.  Inspection of the pelvis showed a normal uterus, ovaries, and tubes. Fallopian tubes were previously surgically interrupted with Filshie clips.  The right mesosalpinx of the fallopian tube was cauterized and cut using the monopolar scissors.   The utero-ovarian ligament was coagulated and cut using the fenestrated bipolar grasper. The round ligament was coagulated and cut. A bladder flap was created and the bladder was dissected down from the cervix.This entire procedure was then repeated on the left side.   The uterine arteries were then skeletonized, and cauterized.  The blue balloon cuff was then identified and anincision was made in the cervicovaginal junction on top of the vaginal cuff. This was also repeated posteriorly. The incision was extended laterally, freeing the  uterus from the surrounding vagina. The uterus was then delivered through the vagina.    The vaginal cuff was closed with a running suture of 0 Vicryl V-lock. The ureters  were unable to be identified bilaterally due to redundant peritoneal tissue. The entire pelvis was hemostatic. Arista was placed over the vaginal cuff and ovarian pedicles for added hemostasis. Each of the port sites were closed with a suture of -2-0 Vicryl in a figure-of eight manner. The skin was closed with 4-0 Monocryl using figure-of-eight stitches. Telfa and Tegaderm were placed for dressing.   Next a cystoscopy was performed using a 30 degree cystoscope.  Approximately 0.5 cc of fluorescein dye had been injected intravenously during incision closure.  The bladder was filled with approximately 200 ml of normal saline. The bladder was surveyed. There were no retained sutures in the bladder, the mucosa was intact with no signs of injury.  Bilateral efflux of the fluorescein dye was noted from the ureteric orifices.  The bladder was allowed to drain and the cystoscope was removed.   The final needle, sponge, and instrument count was correct. The patient tolerated the procedure well. Patient to the recovery room in good condition.      Estimated Blood Loss:  500 ml         Drains: foley catheter to gravity, with 500 ml of clear urine at end of procedure         Total IV Fluids: 3000 ml         Specimens: Uterus with cervix, bilateral fallopian tubes         Complications:  None; patient tolerated the procedure well.         Disposition: PACU - hemodynamically stable.         Condition: stable   Rubie Maid, MD [facility]

## 2016-04-15 NOTE — Anesthesia Preprocedure Evaluation (Signed)
Anesthesia Evaluation  Patient identified by MRN, date of birth, ID band Patient awake    Reviewed: Allergy & Precautions, NPO status , Patient's Chart, lab work & pertinent test results  Airway Mallampati: III  TM Distance: >3 FB Neck ROM: Full    Dental  (+) Teeth Intact   Pulmonary shortness of breath,     + decreased breath sounds      Cardiovascular Exercise Tolerance: Good + DOE  + dysrhythmias  Rhythm:Regular     Neuro/Psych Depression negative neurological ROS     GI/Hepatic negative GI ROS, Neg liver ROS,   Endo/Other  Morbid obesity  Renal/GU      Musculoskeletal negative musculoskeletal ROS (+)   Abdominal (+) + obese,   Peds negative pediatric ROS (+)  Hematology  (+) anemia ,   Anesthesia Other Findings   Reproductive/Obstetrics                             Anesthesia Physical Anesthesia Plan  ASA: III  Anesthesia Plan: General   Post-op Pain Management:    Induction: Intravenous  Airway Management Planned: Oral ETT  Additional Equipment:   Intra-op Plan:   Post-operative Plan: Extubation in OR  Informed Consent: I have reviewed the patients History and Physical, chart, labs and discussed the procedure including the risks, benefits and alternatives for the proposed anesthesia with the patient or authorized representative who has indicated his/her understanding and acceptance.     Plan Discussed with: CRNA  Anesthesia Plan Comments:         Anesthesia Quick Evaluation

## 2016-04-15 NOTE — Anesthesia Postprocedure Evaluation (Signed)
Anesthesia Post Note  Patient: Maria Moore  Procedure(s) Performed: Procedure(s) (LRB): ROBOTIC ASSISTED LAPAROSCOPIC TOTAL HYSTERECTOMY WITH BILATERAL SALPINGECTOMY AND CYSTOSCOPY (N/A)  Patient location during evaluation: PACU Anesthesia Type: General Level of consciousness: awake and alert Pain management: pain level controlled Vital Signs Assessment: post-procedure vital signs reviewed and stable Respiratory status: spontaneous breathing and respiratory function stable Cardiovascular status: stable Anesthetic complications: no    Last Vitals:  Vitals:   04/15/16 0830 04/15/16 1410  BP: (!) 113/92 (!) 144/72  Pulse: 79 92  Resp: 16 20  Temp: 36.9 C 36.4 C    Last Pain:  Vitals:   04/15/16 0830  TempSrc: Oral                 KEPHART,WILLIAM K

## 2016-04-15 NOTE — Anesthesia Procedure Notes (Signed)
Procedure Name: Intubation Date/Time: 04/15/2016 10:15 AM Performed by: Allean Found Pre-anesthesia Checklist: Patient identified, Emergency Drugs available, Suction available, Patient being monitored and Timeout performed Patient Re-evaluated:Patient Re-evaluated prior to inductionOxygen Delivery Method: Circle system utilized Preoxygenation: Pre-oxygenation with 100% oxygen Intubation Type: IV induction Ventilation: Mask ventilation without difficulty Laryngoscope Size: Mac and 3 Grade View: Grade II Tube type: Oral Tube size: 7.0 mm Number of attempts: 1 Airway Equipment and Method: Stylet Placement Confirmation: ETT inserted through vocal cords under direct vision,  positive ETCO2 and breath sounds checked- equal and bilateral Secured at: 21 cm Tube secured with: Tape Dental Injury: Teeth and Oropharynx as per pre-operative assessment

## 2016-04-15 NOTE — H&P (Addendum)
GYNECOLOGY PRE-OPERATIVE  HISTORY AND PHYSICAL  Subjective:    Patient is a 45 y.o. [id number] female scheduled for robotic total laparoscopic hysterectomy with bilateral salpingectomy and possible left oophorectomy. Indications for procedure are abnormal uterine bleeding, intermittent left-sided pelvic pain.   Pertinent Gynecological History: Patient's last menstrual period was 03/09/2016. Menses: flow is irregular and moderate.   Bleeding: dysfunctional uterine bleeding Contraception: tubal ligation Last mammogram: normal Date: 07/2013 Last pap: normal Date: May 07, 2013  Discussed Blood/Blood Products: yes    OB History  Gravida Para Term Preterm AB Living  5         5  SAB TAB Ectopic Multiple Live Births          5    # Outcome Date GA Lbr Len/2nd Weight Sex Delivery Anes PTL Lv  5 Gravida 2010    M Vag-Spont   LIV  4 Gravida 2006    M Vag-Spont   LIV  3 Gravida 2002    F Vag-Spont   LIV  2 Gravida 1997    F Vag-Spont   LIV  1 Gravida 1993    F Vag-Spont   LIV      Past Medical History:  Diagnosis Date  . Anemia   . Depression   . Dysautonomia    MILD FORM OF POTS  . Family history of adverse reaction to anesthesia    PTS MOM HARD TO WAKE UP  . Palpitations   . Plantar fasciitis   . VTE (venous thromboembolism)    x 2. 1st was post partum, 2nd was post procedure, Protein C deficiency ruled out    Family History  Problem Relation Age of Onset  . COPD Mother   . Cancer Mother   . Early death Mother 66    Died at [facility] May 08, 2011  . Hypertension Father   . Kidney disease Father     renal calculi  . Heart disease Father 47    4 vessel CABG   . Cancer Maternal Grandmother     breast cancer  . Stroke Maternal Grandfather   . Mental illness Paternal Grandmother     Past Surgical History:  Procedure Laterality Date  . D&C (otheR)    . ENDOMETRIAL ABLATION    . extraction of wisdom teeth    . HYSTEROSCOPY W/D&C N/A 12/11/2015   Procedure: DILATATION  AND CURETTAGE /HYSTEROSCOPY;  Surgeon: Rubie Maid, MD;  Location: [facility] ORS;  Service: Gynecology;  Laterality: N/A;  . LAPAROSCOPIC TUBAL LIGATION     [place]  . myringotomy and tubes (otheR)      Social History   Social History  . Marital status: Married    Spouse name: N/A  . Number of children: N/A  . Years of education: N/A   Social History Main Topics  . Smoking status: Never Smoker  . Smokeless tobacco: Never Used  . Alcohol use No  . Drug use: No  . Sexual activity: Yes    Birth control/ protection: None   Other Topics Concern  . 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     No current facility-administered medications on file prior to encounter.    Current Outpatient Prescriptions on File Prior to Encounter  Medication Sig Dispense Refill  . citalopram (CELEXA) 20 MG tablet TAKE 1 TABLET (20 MG TOTAL) BY MOUTH DAILY. (Patient taking differently: TAKE 1 TABLET (20 MG TOTAL)  BY MOUTH DAILY-AM) 90 tablet 1  . metoprolol (LOPRESSOR) 50 MG tablet TAKE 1 TABLET (50 MG TOTAL) BY MOUTH 2 (TWO) TIMES DAILY. 180 tablet 3    No Known Allergies  Review of Systems Constitutional: No recent fever/chills/sweats Respiratory: No recent cough/bronchitis Cardiovascular: No chest pain Gastrointestinal: No recent nausea/vomiting/diarrhea Genitourinary: No UTI symptoms Hematologic/lymphatic:No history of coagulopathy or recent blood thinner use    Objective:    BP (!) 113/92   Pulse 79   Temp 98.5 F (36.9 C) (Oral)   Resp 16   Ht 5\' 4"  (1.626 m)   Wt 268 lb (121.6 kg)   LMP 03/09/2016   SpO2 100%   BMI 46.00 kg/m   General:   Normal  Skin:   normal  HEENT:  Normal  Neck:  Supple without Adenopathy or Thyromegaly  Lungs:   Heart:              Breasts:   Abdomen:  Pelvis:  M/S   Extremeties:  Neuro:    clear to auscultation bilaterally   Normal without murmur   Not  Examined   soft, non-tender; bowel sounds normal; no masses,  no organomegaly   Exam deferred to OR  No CVAT  Warm/Dry   Normal          Assessment:    Menorrhagia Morbid obesity H/o DVT (x 2) H/o endometrial ablation LLQ pain (intermittent)   Plan:    Counseling: Procedure, risks, reasons, benefits and complications (including injury to bowel, bladder, major blood vessel, ureter, bleeding, possibility of transfusion, infection, or fistula formation) reviewed in detail. Consent signed. Preop testing ordered. Instructions reviewed, including NPO after midnight. DVT prophylaxis with SCDs as well as Lovenox.     Rubie Maid, MD [facility]

## 2016-04-15 NOTE — H&P (Signed)
Pt urine preg test negative

## 2016-04-16 ENCOUNTER — Encounter: Payer: Self-pay | Admitting: Obstetrics and Gynecology

## 2016-04-16 DIAGNOSIS — N920 Excessive and frequent menstruation with regular cycle: Secondary | ICD-10-CM | POA: Diagnosis not present

## 2016-04-16 LAB — CREATININE, SERUM
Creatinine, Ser: 0.54 mg/dL (ref 0.44–1.00)
GFR calc Af Amer: >60 mL/min (ref >60)
GFR calc non Af Amer: >60 mL/min (ref >60)

## 2016-04-16 LAB — CBC
HCT: 26.1 % — ABNORMAL LOW (ref 35.0–47.0)
Hemoglobin: 8.7 g/dL — ABNORMAL LOW (ref 12.0–16.0)
MCH: 26.1 pg (ref 26.0–34.0)
MCHC: 33.2 g/dL (ref 32.0–36.0)
MCV: 78.5 fL — ABNORMAL LOW (ref 80.0–100.0)
Platelets: 164 10*3/uL (ref 150–440)
RBC: 3.33 MIL/uL — ABNORMAL LOW (ref 3.80–5.20)
RDW: 16.2 % — ABNORMAL HIGH (ref 11.5–14.5)
WBC: 9.1 10*3/uL (ref 3.6–11.0)

## 2016-04-16 MED ORDER — FERROUS SULFATE 325 (65 FE) MG PO TABS: 325.0000 mg | ORAL_TABLET | Freq: Two times a day (BID) | ORAL | 1 refills | Status: DC

## 2016-04-16 MED ORDER — DOCUSATE SODIUM 100 MG PO CAPS: 100.0000 mg | ORAL_CAPSULE | Freq: Two times a day (BID) | ORAL | 2 refills | Status: DC | PRN

## 2016-04-16 MED ORDER — OXYCODONE-ACETAMINOPHEN 5-325 MG PO TABS: 1.0000 | ORAL_TABLET | Freq: Four times a day (QID) | ORAL | 0 refills | Status: DC | PRN

## 2016-04-16 NOTE — Progress Notes (Signed)
Patient understands all discharge instructions and the need to make follow up appointments. Patient discharge via wheelchair with auxillary. 

## 2016-04-16 NOTE — Discharge Instructions (Signed)
General Gynecological Post-Operative Instructions You may expect to feel dizzy, weak, and drowsy for as long as 24 hours after receiving the medicine that made you sleep (anesthetic).  Do not drive a car, ride a bicycle, participate in physical activities, or take public transportation until you are done taking narcotic pain medicines or as directed by your doctor.  Do not drink alcohol or take tranquilizers.  Do not take medicine that has not been prescribed by your doctor.  Do not sign important papers or make important decisions while on narcotic pain medicines.  Have a responsible person with you.  CARE OF INCISION  Keep incision clean and dry. Take showers instead of baths until your doctor gives you permission to take baths.  Avoid heavy lifting (more than 10 pounds/4.5 kilograms), pushing, or pulling.  Avoid activities that may risk injury to your surgical site.  No sexual intercourse or placement of anything in the vagina for 8 weeks or as instructed by your doctor. If you have tubes coming from the wound site, check with your doctor regarding appropriate care of the tubes. Only take prescription or over-the-counter medicines  for pain, discomfort, or fever as directed by your doctor. Do not take aspirin. It can make you bleed. Take medicines (antibiotics) that kill germs if they are prescribed for you.  Call the office or go to the MAU if:  You feel sick to your stomach (nauseous).  You start to throw up (vomit).  You have trouble eating or drinking.  You have an oral temperature above 101.  You have constipation that is not helped by adjusting diet or increasing fluid intake. Pain medicines are a common cause of constipation.  You have any other concerns. SEEK IMMEDIATE MEDICAL CARE IF:  You have persistent dizziness.  You have difficulty breathing or a congested sounding (croupy) cough.  You have an oral temperature above 102.5, not controlled by medicine.  There is increasing  pain or tenderness near or in the surgical site.

## 2016-04-16 NOTE — Progress Notes (Signed)
Subjective: Patient reports incisional pain, tolerating PO and no problems voiding.  Has not yet passed flatus.   Objective: I have reviewed patient's vital signs, intake and output and medications.  Temp:  [97.6 F (36.4 C)-99.5 F (37.5 C)] 99.5 F (37.5 C) (09/19 0743) Pulse Rate:  [78-98] 88 (09/19 0743) Resp:  [16-29] 18 (09/19 0743) BP: (109-144)/(55-92) 109/58 (09/19 0743) SpO2:  [91 %-100 %] 96 % (09/19 0743) Weight:  [268 lb (121.6 kg)] 268 lb (121.6 kg) (09/18 0830)  General: alert and no distress Resp: clear to auscultation bilaterally Cardio: regular rate and rhythm, S1, S2 normal, no murmur, click, rub or gallop GI: soft, non-tender; bowel sounds normal; no masses,  no organomegaly.  Incision bandages c/d/i.  Extremities: extremities normal, atraumatic, no cyanosis or edema Vaginal Bleeding: none    CBC Latest Ref Rng & Units 04/16/2016 04/10/2016  WBC 3.6 - 11.0 K/uL 9.1 7.4  Hemoglobin 12.0 - 16.0 g/dL 8.7(L) 11.7(L)  Hematocrit 35.0 - 47.0 % 26.1(L) 34.8(L)  Platelets 150 - 440 K/uL 164 228    Lab Results  Component Value Date   CREATININE 0.54 04/16/2016    Assessment/Plan: POD#1 s/p robotic TLH with bilateral salpingectomy and cystoscopy.  Doing well post-operatively  Continue routine post-op care Encourage incentive spirometer Encourage ambulation Advance diet.  Mild anemia s/p surgical blood loss, asymptomatic. Will treat with PO iron BID.  Will d/c home later today.   Maria Moore 04/16/2016, 8:09 AM

## 2016-04-16 NOTE — Discharge Summary (Signed)
Gynecology Physician Postoperative Discharge Summary  Patient ID: Maria Moore MRN: [id number] DOB/AGE: 05-May-1971 45 y.o.  Admit Date: 04/15/2016 Discharge Date: 04/16/2016  Preoperative Diagnoses: Abnormal uterine bleeding with h/o endometrial ablation, intermittent left-sided pelvic pain, morbid obesity, h/o DVT (x 2).  Procedures: Procedure(s) (LRB): ROBOTIC ASSISTED LAPAROSCOPIC TOTAL HYSTERECTOMY WITH BILATERAL SALPINGECTOMY AND CYSTOSCOPY (N/A)  Hospital Course:  Maria Moore is a 45 y.o. G5P0  admitted for scheduled surgery.  She underwent the procedures as mentioned above, her operation was uncomplicated. For further details about surgery, please refer to the operative report. Patient had an uncomplicated postoperative course. By time of discharge on POD#1, her pain was controlled on oral pain medications; she was ambulating, voiding without difficulty, tolerating regular diet and passing flatus. She was deemed stable for discharge to home.   Significant Labs: CBC Latest Ref Rng & Units 04/16/2016 04/10/2016 03/30/2016  WBC 3.6 - 11.0 K/uL 9.1 7.4 9.3  Hemoglobin 12.0 - 16.0 g/dL 8.7(L) 11.7(L) 10.9(L)  Hematocrit 35.0 - 47.0 % 26.1(L) 34.8(L) 34.6(L)  Platelets 150 - 440 K/uL 164 228 241    Discharge Exam: Blood pressure (!) 109/58, pulse 88, temperature 99.5 F (37.5 C), temperature source Oral, resp. rate 18, height 5\' 4"  (1.626 m), weight 268 lb (121.6 kg), last menstrual period 03/09/2016, SpO2 96 %. General appearance: alert and no distress  Resp: clear to auscultation bilaterally  Cardio: regular rate and rhythm  GI: soft, non-tender; bowel sounds normal; no masses, no organomegaly.  Incision: C/D/I, no erythema, no drainage noted Pelvic: scant blood on pad  Extremities: extremities normal, atraumatic, no cyanosis or edema and Homans sign is negative, no sign of DVT  Discharged Condition: Stable  Disposition: 01-Home or Self Care     Medication List    TAKE  these medications   citalopram 20 MG tablet Commonly known as:  CELEXA TAKE 1 TABLET (20 MG TOTAL) BY MOUTH DAILY. What changed:  See the new instructions.   docusate sodium 100 MG capsule Commonly known as:  COLACE Take 1 capsule (100 mg total) by mouth 2 (two) times daily as needed.   ferrous sulfate 325 (65 FE) MG tablet Commonly known as:  FERROUSUL Take 1 tablet (325 mg total) by mouth 2 (two) times daily.   metoprolol 50 MG tablet Commonly known as:  LOPRESSOR TAKE 1 TABLET (50 MG TOTAL) BY MOUTH 2 (TWO) TIMES DAILY.   omeprazole 20 MG capsule Commonly known as:  PRILOSEC Take 20 mg by mouth as needed.   ONE-A-DAY WOMENS PO Take by mouth.   oxyCODONE-acetaminophen 5-325 MG tablet Commonly known as:  PERCOCET/ROXICET Take 1-2 tablets by mouth every 6 (six) hours as needed for severe pain (moderate to severe pain (when tolerating fluids)).      [place], MD Follow up in 1 week(s).   Specialties:  Obstetrics and Gynecology, Radiology Why:  Incision check Contact information: [address] [place] [address] [phone]           Signed:  Rubie Maid, MD [facility]

## 2016-04-17 LAB — SURGICAL PATHOLOGY

## 2016-04-23 ENCOUNTER — Ambulatory Visit (INDEPENDENT_AMBULATORY_CARE_PROVIDER_SITE_OTHER): Payer: 59 | Admitting: Obstetrics and Gynecology

## 2016-04-23 ENCOUNTER — Encounter: Payer: Self-pay | Admitting: Obstetrics and Gynecology

## 2016-04-23 VITALS — BP 123/74 | HR 91 | Ht 64.0 in | Wt 272.3 lb

## 2016-04-23 DIAGNOSIS — N800 Endometriosis of uterus: Secondary | ICD-10-CM

## 2016-04-23 DIAGNOSIS — Z86018 Personal history of other benign neoplasm: Secondary | ICD-10-CM

## 2016-04-23 DIAGNOSIS — Z9889 Other specified postprocedural states: Secondary | ICD-10-CM

## 2016-04-23 DIAGNOSIS — Z90710 Acquired absence of both cervix and uterus: Secondary | ICD-10-CM

## 2016-04-23 DIAGNOSIS — T402X5A Adverse effect of other opioids, initial encounter: Secondary | ICD-10-CM

## 2016-04-23 DIAGNOSIS — N809 Endometriosis, unspecified: Secondary | ICD-10-CM

## 2016-04-23 DIAGNOSIS — K5903 Drug induced constipation: Secondary | ICD-10-CM

## 2016-04-23 DIAGNOSIS — N8003 Adenomyosis of the uterus: Secondary | ICD-10-CM

## 2016-04-23 DIAGNOSIS — D251 Intramural leiomyoma of uterus: Secondary | ICD-10-CM

## 2016-04-23 HISTORY — DX: Personal history of other benign neoplasm: Z86.018

## 2016-04-23 NOTE — Progress Notes (Signed)
   GYNECOLOGY POST-OPERATIVE CLINIC VISIT  Subjective:     Maria Moore is a 45 y.o. female who presents to the clinic 1 weeks status post robotic TLH with bilateral salpingectomy for abnormal uterine bleeding. Eating a regular diet without difficulty. Bowel movements are abnormal with constipation, treating with Colace. Pain is controlled with current analgesics. Medications being used: prescription NSAID's including ibuprofen (Motrin).  The following portions of the patient's history were reviewed and updated as appropriate: allergies, current medications, past family history, past medical history, past social history, past surgical history and problem list.  Review of Systems Pertinent items noted in HPI and remainder of comprehensive ROS otherwise negative.    Objective:    BP 123/74 (BP Location: Left Arm, Patient Position: Sitting, Cuff Size: Large)   Pulse 91   Ht 5\' 4"  (1.626 m)   Wt 272 lb 4.8 oz (123.5 kg)   LMP 03/09/2016   BMI 46.74 kg/m  General:  alert and no distress  Abdomen: soft, bowel sounds active, non-tender  Incision:   healing well, no drainage, no erythema, no hernia, no seroma, no swelling, no dehiscence, incision well approximated         Pathology 04/15/16:  A. UTERUS WITH CERVIX AND BILATERAL FALLOPIAN TUBES; HYSTERECTOMY WITH  BILATERAL SALPINGECTOMY:  - CERVIX WITH NABOTHIAN CYSTS AND SQUAMOUS METAPLASIA.  - PROLIFERATIVE ENDOMETRIUM.  - ADENOMYOSIS.  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  - BILATERAL FALLOPIAN TUBES WITHOUT PATHOLOGIC CHANGE.   Assessment:    Doing well postoperatively. S/p robotic TLH with bilateral salpingectomy Adenomyosis Constipation (likely secondary to pain meds)  Plan:    1. Continue any current medications.  Has d/c narcotics (2 days ago), should improve constipation.  Encouraged increased hydration, continue Colace.  2. Wound care discussed. 3. Operative findings  again reviewed. Pathology report discussed 4. Activity restrictions: no bending, stooping, or squatting, no lifting more than 15 pounds and pelvic rest 5. Anticipated return to work: 5 weeks. 6. Follow up: 5 weeks for final post-operative check.    Rubie Maid, MD [facility]

## 2016-04-26 DIAGNOSIS — Z0289 Encounter for other administrative examinations: Secondary | ICD-10-CM

## 2016-05-02 ENCOUNTER — Ambulatory Visit (INDEPENDENT_AMBULATORY_CARE_PROVIDER_SITE_OTHER): Payer: 59 | Admitting: Obstetrics and Gynecology

## 2016-05-02 ENCOUNTER — Encounter: Payer: Self-pay | Admitting: Obstetrics and Gynecology

## 2016-05-02 VITALS — BP 109/73 | HR 93 | Temp 98.2°F

## 2016-05-02 DIAGNOSIS — IMO0001 Reserved for inherently not codable concepts without codable children: Secondary | ICD-10-CM

## 2016-05-02 DIAGNOSIS — T814XXA Infection following a procedure, initial encounter: Secondary | ICD-10-CM

## 2016-05-02 MED ORDER — AMOXICILLIN 500 MG PO CAPS: 500.0000 mg | ORAL_CAPSULE | Freq: Three times a day (TID) | ORAL | 0 refills | Status: DC

## 2016-05-03 ENCOUNTER — Telehealth: Payer: Self-pay | Admitting: Obstetrics and Gynecology

## 2016-05-03 NOTE — Telephone Encounter (Signed)
Left message for pt that I will need for Dr Marcelline Mates to determine what restrictions are and will call when letter is ready.

## 2016-05-03 NOTE — Telephone Encounter (Signed)
This pt was here yesterday w/ inf in incision. She for got to see if the letter she requested for work was ready. She gave [place] Disability papers last Monday and it had on a note attached that she needed a noet to go back to work, will you check on that and call the pt

## 2016-05-04 NOTE — Progress Notes (Signed)
   GYNECOLOGY POST-OPERATIVE CLINIC VISIT  Subjective:     Maria Moore is a 45 y.o. [id number] female who presents to the clinic 2 weeks following a robotic TLH with bilateral salpingectomy for abnormal uterine bleeding.  Patient reports that she thinks one of her incision sites may be infected.  Is noting that the area is becoming more sore and red. States that she saw a small amount of pus from the area once she took off the steri-strips.   Objective:    BP 109/73   Pulse 93   Temp 98.2 F (36.8 C) (Oral)   LMP 03/09/2016   General:  alert, no distress and moderately obese  Abdomen: soft, bowel sounds active, non-tender  Incision:   Right lateral port site with mild erythema noted, skin edges not approximatedAll others healing well, no drainage, no erythema, no hernia, no seroma, no swelling, no dehiscence, incision well approximated.      Assessment:    Postoperative course complicated by superficial wound cellulitis    Plan:    1. Continue any current medications. 2. Wound care discussed. Advised on using Neosporin to area daily x 1 week.  Will also prescribe Amoxicillin for cellulitis.   3. Follow up in 4 weeks, or return sooner if symptoms worsen.    Rubie Maid, MD [facility]

## 2016-05-10 ENCOUNTER — Telehealth: Payer: Self-pay | Admitting: Internal Medicine

## 2016-05-10 NOTE — Telephone Encounter (Signed)
Pt dropped off health assessment to be completed by Dr. Derrel Nip. Paper is up front in color folder.

## 2016-05-28 ENCOUNTER — Encounter: Payer: Self-pay | Admitting: Obstetrics and Gynecology

## 2016-05-28 ENCOUNTER — Ambulatory Visit (INDEPENDENT_AMBULATORY_CARE_PROVIDER_SITE_OTHER): Payer: 59 | Admitting: Obstetrics and Gynecology

## 2016-05-28 VITALS — BP 123/77 | HR 84 | Ht 64.75 in | Wt 269.5 lb

## 2016-05-28 DIAGNOSIS — N800 Endometriosis of uterus: Secondary | ICD-10-CM

## 2016-05-28 DIAGNOSIS — D251 Intramural leiomyoma of uterus: Secondary | ICD-10-CM

## 2016-05-28 DIAGNOSIS — N809 Endometriosis, unspecified: Secondary | ICD-10-CM

## 2016-05-28 DIAGNOSIS — N8003 Adenomyosis of the uterus: Secondary | ICD-10-CM

## 2016-05-28 DIAGNOSIS — Z90710 Acquired absence of both cervix and uterus: Secondary | ICD-10-CM

## 2016-05-28 DIAGNOSIS — Z9889 Other specified postprocedural states: Secondary | ICD-10-CM

## 2016-05-28 NOTE — Progress Notes (Signed)
   GYNECOLOGY POST-OPERATIVE CLINIC VISIT  Subjective:     Maria Moore is a 45 y.o. female who presents to the clinic 6 weeks status post robotic TLH with bilateral salpingectomy for abnormal uterine bleeding. Eating a regular diet without difficulty. Bowel movements are normal. The patient is not having any pain.  The following portions of the patient's history were reviewed and updated as appropriate: allergies, current medications, past family history, past medical history, past social history, past surgical history and problem list.  Review of Systems Pertinent items noted in HPI and remainder of comprehensive ROS otherwise negative.    Objective:    BP 123/77 (BP Location: Left Arm, Patient Position: Sitting, Cuff Size: Large)   Pulse 84   Ht 5' 4.75" (1.645 m)   Wt 269 lb 8 oz (122.2 kg)   LMP 03/09/2016   BMI 45.19 kg/m  General:  alert and no distress  Abdomen: soft, bowel sounds active, non-tender  Incision:   well healed, no drainage, no erythema, no hernia, incision well approximated  Pelvis:   external genitalia normal.  Vagina with scant white thin discharge, vaginal cuff well healed, non-tender.          Pathology 04/15/16:  A. UTERUS WITH CERVIX AND BILATERAL FALLOPIAN TUBES; HYSTERECTOMY WITH  BILATERAL SALPINGECTOMY:  - CERVIX WITH NABOTHIAN CYSTS AND SQUAMOUS METAPLASIA.  - PROLIFERATIVE ENDOMETRIUM.  - ADENOMYOSIS.  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  - BILATERAL FALLOPIAN TUBES WITHOUT PATHOLOGIC CHANGE.   Assessment:    Doing well postoperatively. S/p robotic TLH with bilateral salpingectomy Adenomyosis Fibroid uterus  Plan:   1.Operative findings again reviewed. Pathology report discussed 2. Activity restrictions: Pelvic rest for 1 additional week.  Can resume all other activities.  3. Anticipated return to work: now. 4. Follow up: 9 months for annual exam.    Rubie Maid, MD [facility]

## 2016-07-21 ENCOUNTER — Other Ambulatory Visit: Payer: Self-pay | Admitting: Internal Medicine

## 2017-01-22 ENCOUNTER — Other Ambulatory Visit: Payer: Self-pay | Admitting: Internal Medicine

## 2017-02-13 IMAGING — US US PELVIS COMPLETE
1 series · 13 of 25 positions shown · non-contrast
Comparison: None

CLINICAL DATA: Vaginal bleeding for 1 month. This has become
heavier.

EXAM:
TRANSABDOMINAL AND TRANSVAGINAL ULTRASOUND OF PELVIS
TECHNIQUE: Both transabdominal and transvaginal ultrasound examinations of the
pelvis were performed. Transabdominal technique was performed for
global imaging of the pelvis including uterus, ovaries, adnexal
regions, and pelvic cul-de-sac. It was necessary to proceed with
endovaginal exam following the transabdominal exam to visualize the
endometrium and ovaries to better advantage.

[Series 1: us pelvis complete · 0.25mm/px · 13 of 87 slices shown]
[im 1/87]
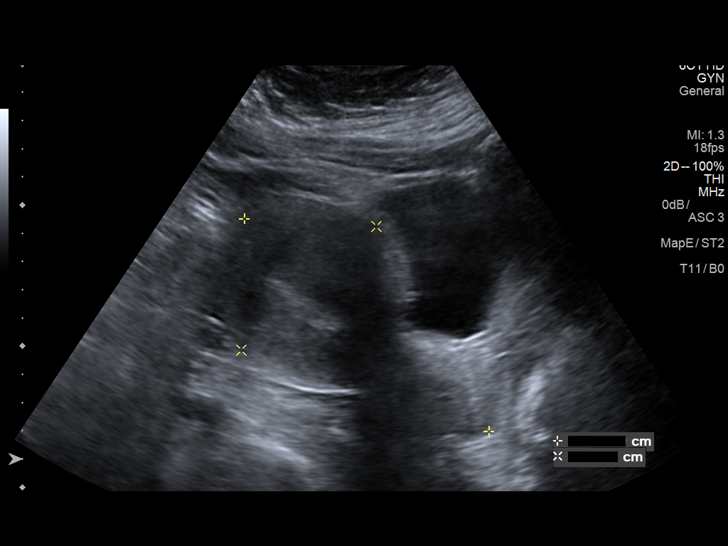
[im 8/87]
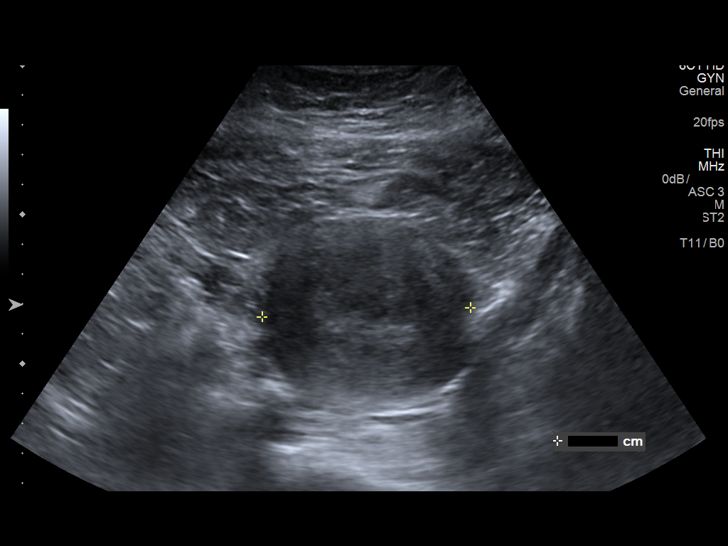
[im 15/87]
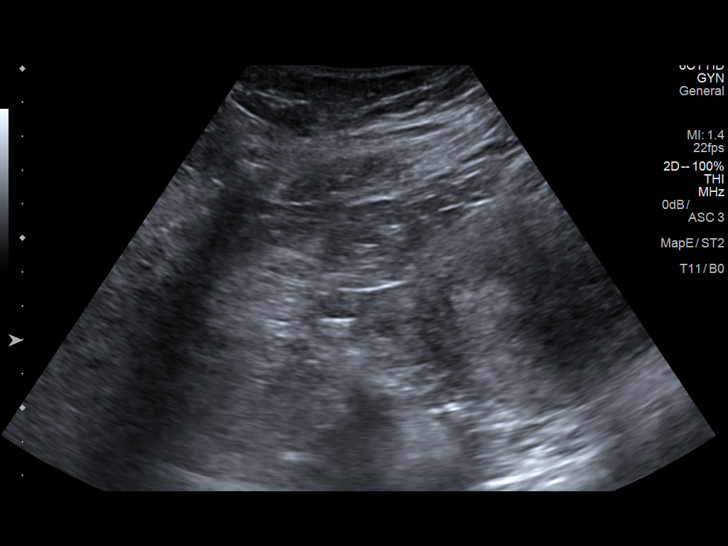
[im 22/87]
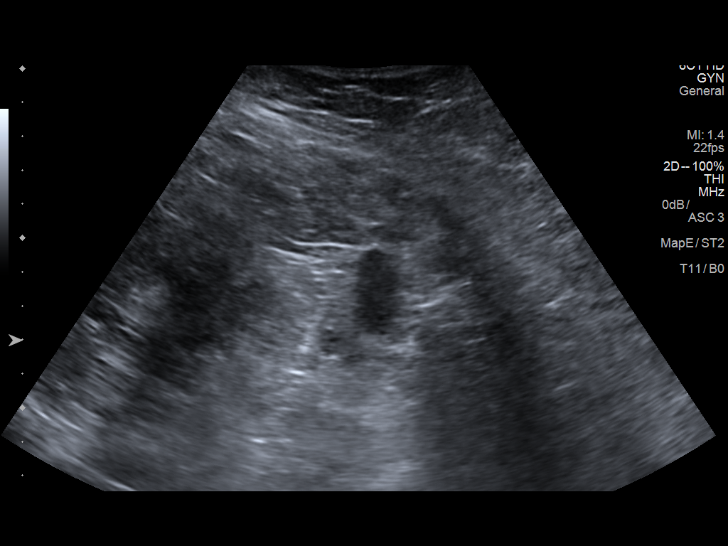
[im 29/87]
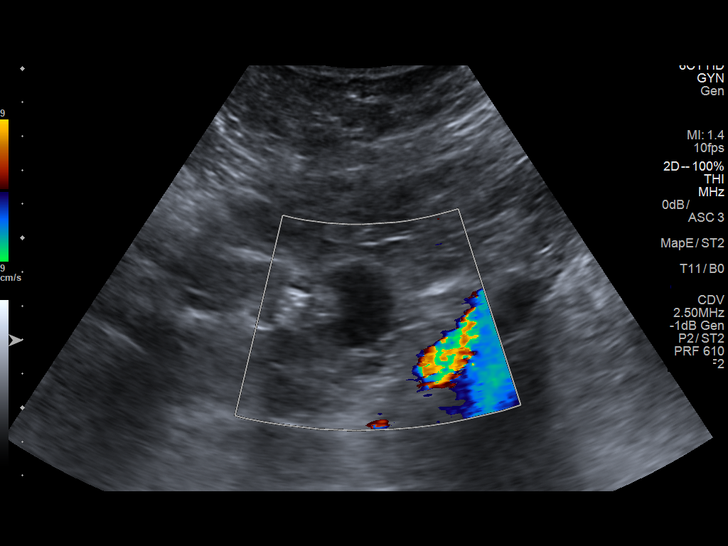
[im 36/87]
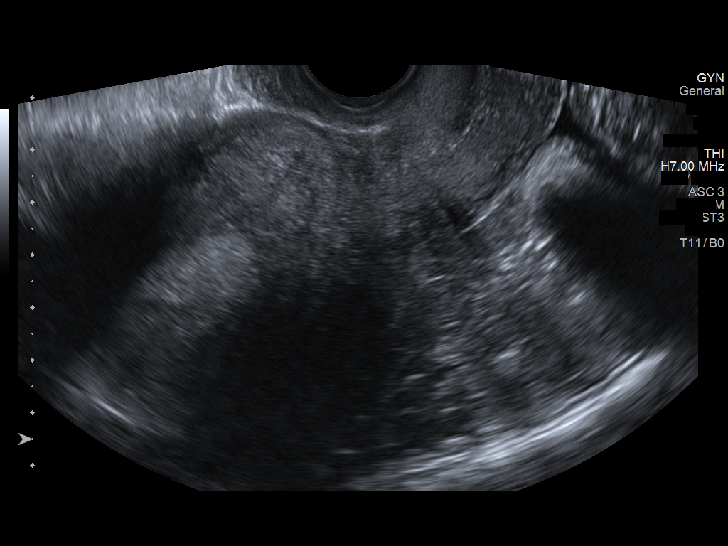
[im 44/87]
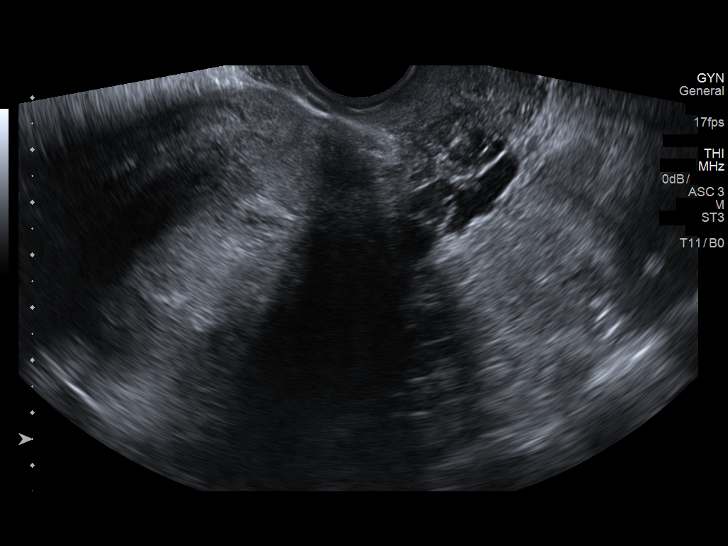
[im 51/87]
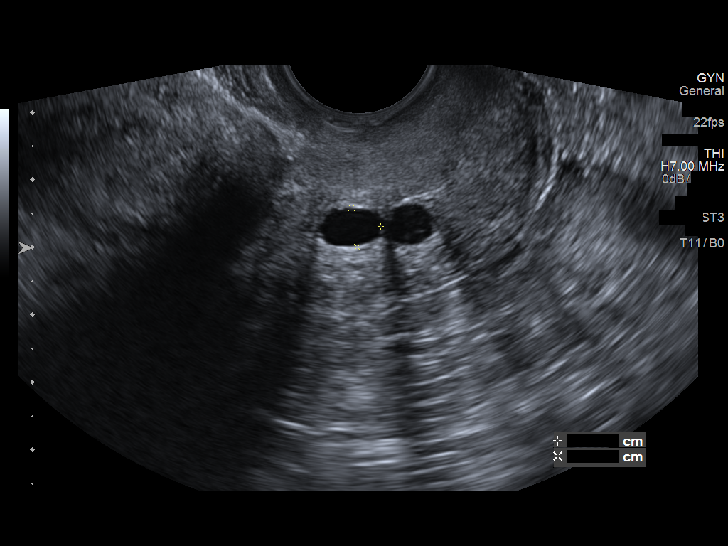
[im 58/87]
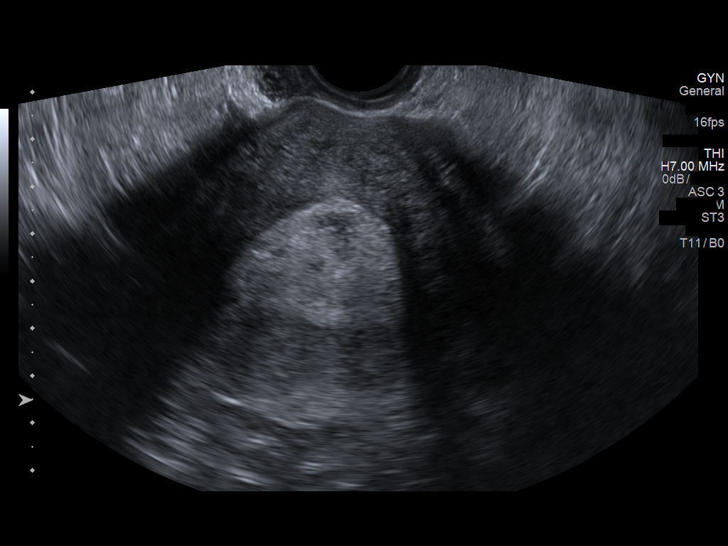
[im 65/87]
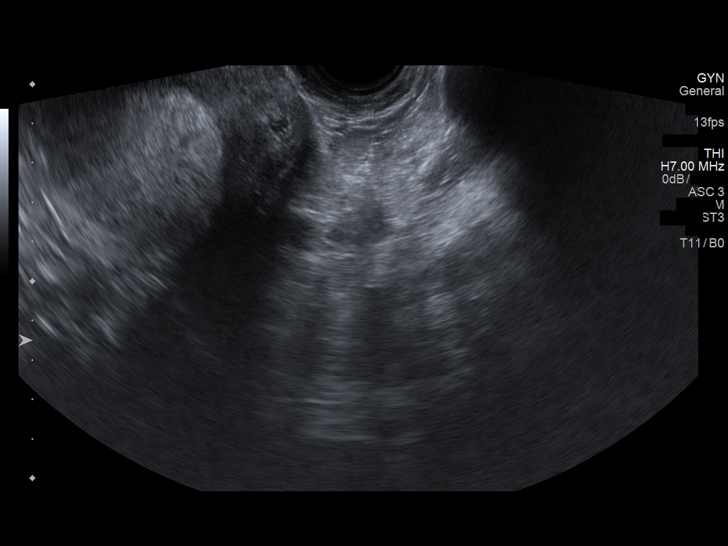
[im 72/87]
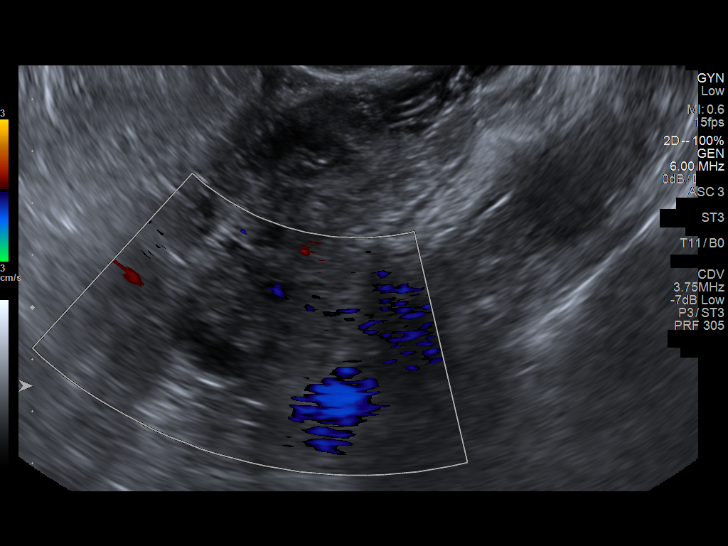
[im 79/87]
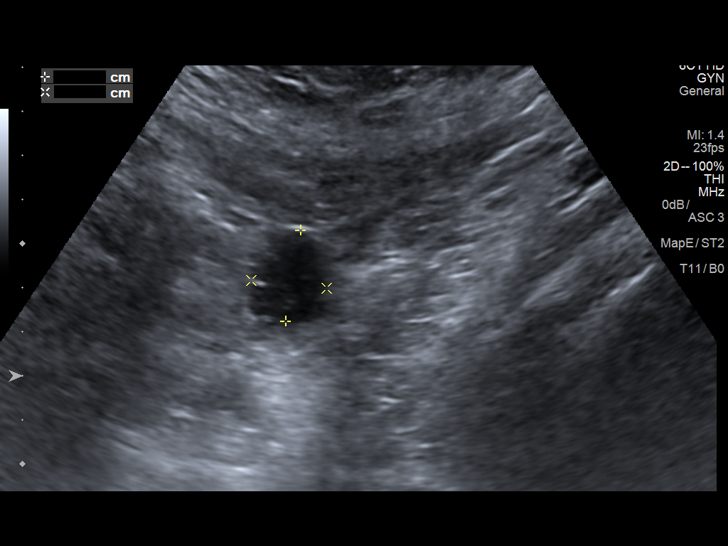
[im 87/87]
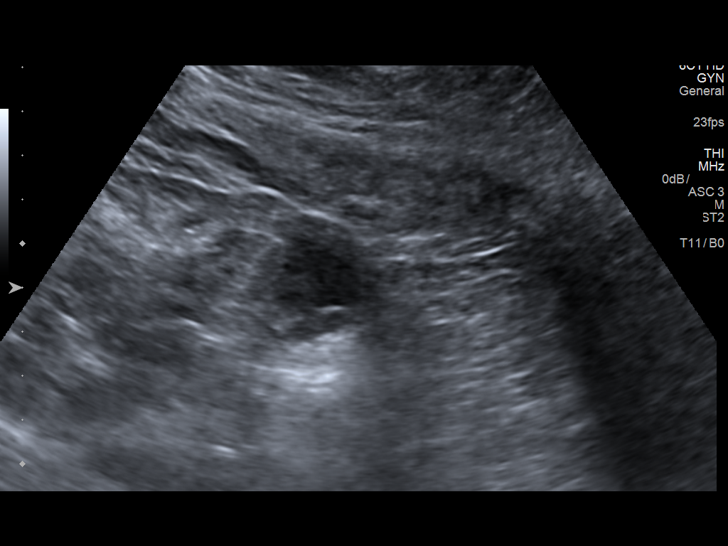

[13 of 25 positions shown; findings below may reference images not displayed]

FINDINGS: Uterus

Measurements: 11.5 x 6.5 x 7.0 cm. No mass or fibroid. Several
nabothian cysts noted arising from the upper cervix, incidental.

Endometrium

Thickness: 2.3 cm. Heterogeneous echogenicity with several, small,
ill-defined hypo to anechoic areas. No discrete mass.

Right ovary

Measurements: 2.8 x 2.1 x 3.0 cm. Normal appearance/no adnexal mass.

Left ovary

Measurements: 4.8 x 2.3 x 2.6 cm. Mildly complicated 2.1 cm presumed
follicular cyst. No adnexal masses.

Other findings

Trace pelvic free fluid.
IMPRESSION: 1. Endometrium is thickened and heterogeneous, abnormal for this
patient's age. If bleeding remains unresponsive to hormonal or
medical therapy, focal lesion work-up with sonohysterogram should be
considered. Endometrial biopsy should also be considered in
pre-menopausal patients at high risk for endometrial carcinoma.
(Ref: Radiological Reasoning: Algorithmic Workup of Abnormal Vaginal
Bleeding with Endovaginal Sonography and Sonohysterography. AJR
[id number]; 191:S68-73)
2. No other significant finding.

## 2017-02-25 ENCOUNTER — Encounter: Payer: Self-pay | Admitting: Obstetrics and Gynecology

## 2017-02-25 ENCOUNTER — Ambulatory Visit (INDEPENDENT_AMBULATORY_CARE_PROVIDER_SITE_OTHER): Payer: 59 | Admitting: Obstetrics and Gynecology

## 2017-02-25 VITALS — BP 124/82 | HR 90 | Ht 64.75 in | Wt 274.6 lb

## 2017-02-25 DIAGNOSIS — Z01419 Encounter for gynecological examination (general) (routine) without abnormal findings: Secondary | ICD-10-CM | POA: Diagnosis not present

## 2017-02-25 DIAGNOSIS — E7800 Pure hypercholesterolemia, unspecified: Secondary | ICD-10-CM

## 2017-02-25 DIAGNOSIS — Z862 Personal history of diseases of the blood and blood-forming organs and certain disorders involving the immune mechanism: Secondary | ICD-10-CM | POA: Diagnosis not present

## 2017-02-25 NOTE — Progress Notes (Signed)
GYNECOLOGY ANNUAL PHYSICAL EXAM PROGRESS NOTE  Subjective:    Maria Moore is a 46 y.o. [id number] female who presents for an annual exam. The patient has no complaints today. The patient is sexually active. The patient wears seatbelts: yes. The patient participates in regular exercise: no. Has the patient ever been transfused or tattooed?: no. The patient reports that there is not domestic violence in her life.   Patient notes that she and her family are planning on relocating in the next few weeks due to her receiving a promotion at her job.    Gynecologic History:   Menarche age: 59 Patient's last menstrual period was 03/09/2016. Contraception: status post hysterectomy History of STI's: Denies Last Pap: 10/2015. Results were: normal.  Denies h/o abnormal pap smears. Last mammogram: . Results were: normal Last colonoscopy: 4 years ago, colon polyps noted. Needs repeat q 5 years .   Obstetric History   G5   P0   T0   P0   A0   L5    SAB0   TAB0   Ectopic0   Multiple0   Live Births5     # Outcome Date GA Lbr Len/2nd Weight Sex Delivery Anes PTL Lv  5 Gravida 2010    M Vag-Spont   LIV  4 Gravida 2006    M Vag-Spont   LIV  3 Gravida 2002    F Vag-Spont   LIV  2 Gravida 1997    F Vag-Spont   LIV  1 Gravida 1993    F Vag-Spont   LIV      Past Medical History:  Diagnosis Date  . Anemia   . Depression   . Dysautonomia    MILD FORM OF POTS  . Family history of adverse reaction to anesthesia    PTS MOM HARD TO WAKE UP  . History of uterine fibroid 04/23/2016   noted on pathology s/p hysterectomy  . Palpitations   . Plantar fasciitis   . Uterus, adenomyosis 03/2016   noted on pathology after hysterectomy  . VTE (venous thromboembolism)    x 2. 1st was post partum, 2nd was post procedure, Protein C deficiency ruled out    Past Surgical History:  Procedure Laterality Date  . D&C (otheR)    . ENDOMETRIAL ABLATION    . extraction of wisdom teeth    . HYSTEROSCOPY W/D&C  N/A 12/11/2015   Procedure: DILATATION AND CURETTAGE /HYSTEROSCOPY;  Surgeon: Rubie Maid, MD;  Location: [facility];  Service: Gynecology;  Laterality: N/A;  . LAPAROSCOPIC TUBAL LIGATION     [place]  . myringotomy and tubes (otheR)    . ROBOTIC ASSISTED LAP VAGINAL HYSTERECTOMY N/A 04/15/2016   Procedure: ROBOTIC ASSISTED LAPAROSCOPIC TOTAL HYSTERECTOMY WITH BILATERAL SALPINGECTOMY AND CYSTOSCOPY;  Surgeon: Rubie Maid, MD;  Location: [facility];  Service: Gynecology;  Laterality: N/A;  . TUBAL LIGATION      Family History  Problem Relation Age of Onset  . COPD Mother   . Cancer Mother   . Early death Mother 70       Died at [facility] 06-10-11  . Hypertension Father   . Kidney disease Father        renal calculi  . Heart disease Father 48       4 vessel CABG   . Cancer Maternal Grandmother        breast cancer  . Stroke Maternal Grandfather   . Mental illness Paternal Grandmother     Social  History   Social History  . Marital status: Married    Spouse name: N/A  . Number of children: N/A  . Years of education: N/A   Occupational History  . Not on file.   Social History Main Topics  . Smoking status: Never Smoker  . Smokeless tobacco: Never Used  . Alcohol use No  . Drug use: No  . Sexual activity: Yes    Birth control/ protection: Surgical   Other Topics Concern  . 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     Current Outpatient Prescriptions on File Prior to Visit  Medication Sig Dispense Refill  . citalopram (CELEXA) 20 MG tablet TAKE 1 TABLET (20 MG TOTAL) BY MOUTH DAILY. 90 tablet 1  . metoprolol (LOPRESSOR) 50 MG tablet TAKE 1 TABLET (50 MG TOTAL) BY MOUTH 2 (TWO) TIMES DAILY. 180 tablet 3  . Multiple Vitamins-Calcium (ONE-A-DAY WOMENS PO) Take by mouth.    Marland Kitchen omeprazole (PRILOSEC) 20 MG capsule Take 20 mg by mouth as needed.     No current  facility-administered medications on file prior to visit.     No Known Allergies    Review of Systems Constitutional: negative for chills, fatigue, fevers and sweats Eyes: negative for irritation, redness and visual disturbance Ears, nose, mouth, throat, and face: negative for hearing loss, nasal congestion, snoring and tinnitus Respiratory: negative for asthma, cough, sputum Cardiovascular: negative for chest pain, dyspnea, exertional chest pressure/discomfort, irregular heart beat, palpitations and syncope Gastrointestinal: negative for abdominal pain, change in bowel habits, nausea and vomiting Genitourinary: negative for abnormal menstrual periods, genital lesions, sexual problems and vaginal discharge, dysuria and urinary incontinence Integument/breast: negative for breast lump, breast tenderness and nipple discharge Hematologic/lymphatic: negative for bleeding and easy bruising Musculoskeletal:negative for back pain and muscle weakness Neurological: negative for dizziness, headaches, vertigo and weakness Endocrine: negative for diabetic symptoms including polydipsia, polyuria and skin dryness Allergic/Immunologic: negative for hay fever and urticaria        Objective:  Blood pressure 124/82, pulse 90, height 5' 4.75" (1.645 m), weight 274 lb 9.6 oz (124.6 kg), last menstrual period 03/09/2016. Body mass index is 46.05 kg/m.    General Appearance:    Alert, cooperative, no distress, appears stated age, morbidly obese  Head:    Normocephalic, without obvious abnormality, atraumatic  Eyes:    PERRL, conjunctiva/corneas clear, EOM's intact, both eyes  Ears:    Normal external ear canals, both ears  Nose:   Nares normal, septum midline, mucosa normal, no drainage or sinus tenderness  Throat:   Lips, mucosa, and tongue normal; teeth and gums normal  Neck:   Supple, symmetrical, trachea midline, no adenopathy; thyroid: no enlargement/tenderness/nodules; no carotid bruit or JVD    Back:     Symmetric, no curvature, ROM normal, no CVA tenderness  Lungs:     Clear to auscultation bilaterally, respirations unlabored  Chest Wall:    No tenderness or deformity   Heart:    Regular rate and rhythm, S1 and S2 normal, no murmur, rub or gallop  Breast Exam:    No tenderness, masses, or nipple abnormality  Abdomen:     Soft, non-tender, bowel sounds active all four quadrants, no masses, no organomegaly.    Genitalia:    Pelvic:external genitalia normal, vagina without lesions, discharge, or tenderness, rectovaginal septum  normal. Cervix normal in appearance, no cervical motion tenderness, no adnexal  masses or tenderness.  Uterus normal size, shape, mobile, regular contours, nontender.  Rectal:    Normal external sphincter.  No hemorrhoids appreciated. Internal exam not done.   Extremities:   Extremities normal, atraumatic, no cyanosis or edema  Pulses:   2+ and symmetric all extremities  Skin:   Skin color, texture, turgor normal, no rashes or lesions  Lymph nodes:   Cervical, supraclavicular, and axillary nodes normal  Neurologic:   CNII-XII intact, normal strength, sensation and reflexes throughout   .  Labs:  Lab Results  Component Value Date   WBC 9.1 04/16/2016   HGB 8.7 (L) 04/16/2016   HCT 26.1 (L) 04/16/2016   MCV 78.5 (L) 04/16/2016   PLT 164 04/16/2016    Lab Results  Component Value Date   CREATININE 0.54 04/16/2016   BUN 14 04/10/2016   NA 138 04/10/2016   K 3.8 04/10/2016   CL 105 04/10/2016   CO2 24 04/10/2016    Lab Results  Component Value Date   ALT 24 11/09/2015   AST 21 11/09/2015   ALKPHOS 66 11/09/2015   BILITOT 0.3 11/09/2015    Lab Results  Component Value Date   TSH 1.280 11/09/2015    Lab Results  Component Value Date   CHOL 219 (H) 06/11/2011   HDL 51.40 06/11/2011   LDLDIRECT 164.4 06/11/2011   TRIG 91.0 06/11/2011   CHOLHDL 4 06/11/2011    Assessment:    Healthy female exam.   Morbid obesity  History of anemia   Hypercholesterolemia  Plan:     Blood tests: CBC with diff, Comprehensive metabolic panel and Lipoproteins. Breast self exam technique reviewed and patient encouraged to perform self-exam monthly. Contraception: status post hysterectomy. Discussed healthy lifestyle modifications. Pap smears no longer required, is s/p hysterectomy Patient relocating to [place], can request medical records at check out.    Rubie Maid, MD [facility]

## 2017-02-26 LAB — COMPREHENSIVE METABOLIC PANEL
ALT: 26 IU/L (ref 0–32)
AST: 26 IU/L (ref 0–40)
Albumin/Globulin Ratio: 1.4 (ref 1.2–2.2)
Albumin: 4 g/dL (ref 3.5–5.5)
Alkaline Phosphatase: 73 IU/L (ref 39–117)
BUN/Creatinine Ratio: 23 (ref 9–23)
BUN: 15 mg/dL (ref 6–24)
Bilirubin Total: 0.3 mg/dL (ref 0.0–1.2)
CO2: 23 mmol/L (ref 20–29)
Calcium: 9.3 mg/dL (ref 8.7–10.2)
Chloride: 103 mmol/L (ref 96–106)
Creatinine, Ser: 0.66 mg/dL (ref 0.57–1.00)
GFR calc Af Amer: 121 mL/min/{1.73_m2} (ref >59)
GFR calc non Af Amer: 105 mL/min/{1.73_m2} (ref >59)
Globulin, Total: 2.8 g/dL (ref 1.5–4.5)
Glucose: 86 mg/dL (ref 65–99)
Potassium: 4 mmol/L (ref 3.5–5.2)
Sodium: 141 mmol/L (ref 134–144)
Total Protein: 6.8 g/dL (ref 6.0–8.5)

## 2017-02-26 LAB — LIPID PANEL
Chol/HDL Ratio: 4 ratio (ref 0.0–4.4)
Cholesterol, Total: 171 mg/dL (ref 100–199)
HDL: 43 mg/dL (ref >39)
LDL Calculated: 98 mg/dL (ref 0–99)
Triglycerides: 150 mg/dL — ABNORMAL HIGH (ref 0–149)
VLDL Cholesterol Cal: 30 mg/dL (ref 5–40)

## 2017-02-26 LAB — CBC
Hematocrit: 38 % (ref 34.0–46.6)
Hemoglobin: 12.1 g/dL (ref 11.1–15.9)
MCH: 26.5 pg — ABNORMAL LOW (ref 26.6–33.0)
MCHC: 31.8 g/dL (ref 31.5–35.7)
MCV: 83 fL (ref 79–97)
Platelets: 226 10*3/uL (ref 150–379)
RBC: 4.57 x10E6/uL (ref 3.77–5.28)
RDW: 15.9 % — ABNORMAL HIGH (ref 12.3–15.4)
WBC: 7.1 10*3/uL (ref 3.4–10.8)

## 2020-09-13 ENCOUNTER — Telehealth: Payer: Self-pay | Admitting: Internal Medicine

## 2020-09-13 NOTE — Telephone Encounter (Signed)
Patient has been contacted at least 3 times for a recall, recall has been deleted
